# Patient Record
Sex: Female | Born: 1960 | Race: White | Hispanic: No | Marital: Married
Health system: Northeastern US, Academic
[De-identification: ages and names within clinical notes are randomized; demographics above are authoritative.]

---

## 2019-11-30 ENCOUNTER — Encounter: Admit: 2019-11-30 | Payer: PRIVATE HEALTH INSURANCE | Attending: Hematology & Oncology

## 2019-12-01 ENCOUNTER — Encounter: Admit: 2019-12-01 | Payer: PRIVATE HEALTH INSURANCE

## 2019-12-01 ENCOUNTER — Encounter: Admit: 2019-12-01 | Payer: PRIVATE HEALTH INSURANCE | Attending: Hematology & Oncology

## 2019-12-10 ENCOUNTER — Encounter: Admit: 2019-12-10 | Payer: PRIVATE HEALTH INSURANCE | Attending: Hematology & Oncology

## 2020-02-27 ENCOUNTER — Encounter: Admit: 2020-02-27 | Payer: PRIVATE HEALTH INSURANCE

## 2020-07-19 ENCOUNTER — Encounter: Admit: 2020-07-19 | Payer: PRIVATE HEALTH INSURANCE | Attending: Hematology & Oncology

## 2020-07-20 ENCOUNTER — Encounter: Admit: 2020-07-20 | Payer: PRIVATE HEALTH INSURANCE | Attending: Hematology & Oncology

## 2021-02-03 ENCOUNTER — Telehealth: Admit: 2021-02-03 | Payer: PRIVATE HEALTH INSURANCE | Attending: Hematology & Oncology

## 2021-02-03 DIAGNOSIS — Z1231 Encounter for screening mammogram for malignant neoplasm of breast: Secondary | ICD-10-CM

## 2021-02-03 DIAGNOSIS — C50411 Malignant neoplasm of upper-outer quadrant of right female breast: Secondary | ICD-10-CM

## 2021-02-03 NOTE — Telephone Encounter
Mammogram order faxed to [facility] in [place] [phone].

## 2021-02-03 NOTE — Telephone Encounter
Patient stated she needs a script for a mammogram to be faxed to [facility] in [place]. The fax number is [phone].

## 2021-02-04 ENCOUNTER — Telehealth: Admit: 2021-02-04 | Payer: PRIVATE HEALTH INSURANCE | Attending: Hematology & Oncology

## 2021-02-04 ENCOUNTER — Encounter: Admit: 2021-02-04 | Payer: PRIVATE HEALTH INSURANCE

## 2021-02-04 NOTE — Telephone Encounter
Called and spoke with pt regarding mammogram rx. Informed pt she will need to see Dr. Leavy Cella prior to any breast imaging rx's as pt has not seen med onc in 2 yrs. Pt verbalized understanding, message sent to [facility] [address]k to schedule pt.

## 2021-02-04 NOTE — Telephone Encounter
Pt called needing to speak with a nurse needs clarification on scripts she requested.

## 2021-02-04 NOTE — Telephone Encounter
Spoke to [place]. She is requesting unilateral left side 3D mammo and B/L breast MRI scripts. Tinnie Gens, RN to call pt.

## 2021-02-11 ENCOUNTER — Ambulatory Visit: Admit: 2021-02-11 | Payer: PRIVATE HEALTH INSURANCE | Attending: Hematology & Oncology

## 2021-02-11 DIAGNOSIS — E8940 Asymptomatic postprocedural ovarian failure: Secondary | ICD-10-CM

## 2021-02-11 DIAGNOSIS — Z90722 Acquired absence of ovaries, bilateral: Secondary | ICD-10-CM

## 2021-02-11 DIAGNOSIS — E038 Other specified hypothyroidism: Secondary | ICD-10-CM

## 2021-02-11 DIAGNOSIS — Z170 Estrogen receptor positive status [ER+]: Secondary | ICD-10-CM

## 2021-02-12 DIAGNOSIS — E063 Autoimmune thyroiditis: Secondary | ICD-10-CM

## 2021-02-12 DIAGNOSIS — C50411 Malignant neoplasm of upper-outer quadrant of right female breast: Secondary | ICD-10-CM

## 2021-02-15 ENCOUNTER — Telehealth: Admit: 2021-02-15 | Payer: PRIVATE HEALTH INSURANCE | Attending: Hematology & Oncology

## 2021-02-15 DIAGNOSIS — C50411 Malignant neoplasm of upper-outer quadrant of right female breast: Secondary | ICD-10-CM

## 2021-02-15 NOTE — Telephone Encounter
Spoke with pt, she has recently had a telemedicine visit with Dr. Leavy Cella.  Per pt request she would like Dr. Leavy Cella to order the [facility] and MRI breast bilateral, I did inform her that she has had these in the past with Dr. Elesa Massed but she is quite sure that Dr. Leavy Cella should be ordering this for her.  Dr. Leavy Cella aware and will order the imaging.  She would like to have this done at [facility] in [place] [phone] phone) )fax [phone]).

## 2021-02-15 NOTE — Telephone Encounter
Pt called back again wanting to speak with a nurse.

## 2021-02-16 ENCOUNTER — Telehealth: Admit: 2021-02-16 | Payer: PRIVATE HEALTH INSURANCE | Attending: Hematology & Oncology

## 2021-02-16 NOTE — Telephone Encounter
Pt called.Returned phone call about an MRI.

## 2021-02-18 ENCOUNTER — Telehealth: Admit: 2021-02-18 | Payer: PRIVATE HEALTH INSURANCE | Attending: Hematology & Oncology

## 2021-02-18 NOTE — Telephone Encounter
MRI Breast orders faxed to [facility] [id number] Tax ID # [id number] Phone: [phone]: [phone] images from [facility] were requestedPhone: [phone] [phone] several messages (today and other days this week) with patient to verify the facility and to help her book the scans.

## 2021-02-18 NOTE — Telephone Encounter
Pt called wanting to speak with you.

## 2021-02-22 ENCOUNTER — Encounter: Admit: 2021-02-22 | Payer: PRIVATE HEALTH INSURANCE | Attending: Hematology & Oncology

## 2021-02-28 NOTE — Progress Notes
ONCOLOGY NOTECheryl M Juarez, 07/14/1962Provider: Dr. Andee Poles [facility]: [id number]/13/2022?Interval History: ?  Kim Juarez is a 60 y.o. 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??HPI5/13/2022This patient presents to the clinic today as a follow-up via MyChart Video VisitRemains NED with breast cancer, Stage I, pT1b,pN0, ER+, HER2 +,now ~ 11 yrs post Dx s/p bilateral oophorectomy,without TAHNow 13 years post diagnosis, stableShe has not been seen in 2 yearsThe patient has no complaints at this time, feeling wellQuestions regarding what to do if she were to contract COVID-19; Paxlovid is safe to take and can lower the risk of hospitalization by 80%Questions reviewed, and answers provided The patient is doing well, overallReassurances givenF/u ??06/29/2020This patient presents to the clinic today as a follow-up. Remains NED with breast cancer, Stage I, pT1b,pN0, ER+, HER2 +,now ~ 11 yrs post Dx s/p bilateral oophorectomy,without TAHAs in previous notes,patient refused all f/u endocrine therapy or anti-HER2 therapy,against recommnedationsPatient notes she is doing well overall. She remains free of arthralgias, bone pain, hot flashes, other complaintsReviewed lab results with patient from 03/22/2019 that revealed a TSH WNL and T4 WNL.Continues to exercise and maintain weight with prudent diet, associated with risk reduction?06/26/19This patient presents to the clinic today as a follow-up. She has a history of  Breast cancer, Stage 1 ER+,HER2+,now ~ 10 yrs post DxPt is doing well, overall. Pt has followed up with Dr. Elesa Massed and reports no issues.Labs obtained on 02/26/18 were unremarkable. Pt is asymptomatic today. Asking about her A1C which was 5.7 at last check. Doing wellRecent f/u MRI,breasts noted thickening of skin,lower inner quadrant,right side at implant side post mastectomyShe is now 10 yrs post surgery, has not had f/u with Dr Elesa Massed and suggest she see her for f/u as wellShe is otherwise wellReviewed my exam which was completely negative with no kin changes, no thickening,induration,erythema,warmthExplained that infection unlikely (her concern),but entirely negative exam and favor f/u OV with Dr Dorisann Frames NED with breast cancer, Stage I, pT1b,pN0, ER+, HER2 +, s/p bilateral oophorectomy,without TAHAs in previous notes,patient refused f/u endocrine therapy or anti-HER2 therapy,against recommnedationsShe remains free of arthralgias, bone pain, hot flashes, other complaintsContinues to exercise and maintain weight with prudent diet, associated with risk reduction?Pertinent Medical History: ?    Patient Active Problem List ? Diagnosis ? ? Hypothyroidism due to Hashimoto's thyroiditis ? ? H/O bilateral oophorectomy ? ? Surgical menopause ? ? Malignant neoplasm of upper-outer quadrant of right breast in female, estrogen receptor positive (HC Code) (HC CODE) (HC Code) ? ? ? 2008:  R breast cancer found on mri done given FH breast cancer in mother.  Had mastectomy for associated extensive DCIS.? ?Allergies:    Allergies Allergen Reactions ? Amoxicillin Hives ??Current Medications: No outpatient medications have been marked as taking for the 02/11/21 encounter (Appointment) with Calton Golds, MD. ??History:   Past Medical History: Diagnosis Date ? Cancer (HC Code) 2008 ? Breast cancer ? Hashimoto's thyroiditis ? ? Malignant neoplasm of upper-outer quadrant of right breast in female, estrogen receptor positive (HC Code) 05/19/2012 ? 2008:  R breast cancer found on mri done given FH breast cancer in mother.  Had mastectomy for associated extensive DCIS. ?    Past Surgical History: Procedure Laterality Date ? BILATERAL OOPHORECTOMY Bilateral 03/2007 ? BREAST SURGERY ? 2008 ? rt mastectomy,reconstruction, Drs Elesa Massed,  Atkiss, ?Social History  ?    Socioeconomic History ? Marital status: Married ? ? Spouse name: Not on file ? Number of children: Not on file ? Years of education: Not on file ? Highest education level: Not on file Occupational History ? Not on file Tobacco Use ? Smoking status: Never Smoker ? Smokeless tobacco: Never Used Vaping Use ? Vaping Use: Never used Substance and Sexual Activity ? Alcohol use: No ? Drug use: No ? Sexual activity: Yes ? ? Partners: Male Other Topics Concern ? Not on file Social History Narrative ? Not on file ?Social Determinants of Health ?  Financial Resource Strain:  ? Difficulty of Paying Living Expenses:  Food Insecurity:  ? Worried About Programme researcher, broadcasting/film/video in the Last Year:  ? Barista in the Last Year:  Transportation Needs:  ? Freight forwarder (Medical):  ? Lack of Transportation (Non-Medical):  Physical Activity:  ? Days of Exercise per Week:  ? Minutes of Exercise per Session:  Stress:  ? Feeling of Stress :  Social Connections:  ? Frequency of Communication with Friends and Family:  ? Frequency of Social Gatherings with Friends and Family:  ? Attends Religious Services:  ? Active Member of Clubs or Organizations:  ? Attends Banker Meetings:  ? Marital Status:  Intimate Partner Violence:  ? Fear of Current or Ex-Partner:  ? Emotionally Abused:  ? Physically Abused:  ? Sexually Abused:   ?Family History Problem Relation Age of Onset ? Cancer Mother ? ? Breast cancer Mother ? ? Thyroid cancer Mother ? ? Hypothyroidism Father ? ??Review of Systems: ?Review of SystemsConstitutional: Negative for chills, fever, malaise/fatigue and weight loss.HENT: Negative.Eyes: Negative for blurred vision, double vision, photophobia, pain, discharge and redness.Respiratory: Negative for cough and shortness of breath.Cardiovascular: Negative for chest pain, orthopnea and leg swelling.Gastrointestinal: Negative for abdominal pain, blood in stool, constipation, diarrhea, melena, nausea and vomiting.Genitourinary: Negative.Musculoskeletal: Negative.Neurological: Negative for focal weakness.Psychiatric/Behavioral: Negative for memory loss.?As noted in HPI, otherwise 10 point review of systems negative.?Physical Exam: ?No physical exam or vitals obtained due to Mychart video visit.?Labs and Imaging: Recent Labs:          Recent Lab Values  ?   04/28/2015 04/28/2015 07/14/2015 03/27/2018 08/20/2018 ?? ?? ?? ? ? 12:41 PM 12:45 PM 12:26 PM 12:50 PM  9:26 AM ? ? ? ? WBC 5.0 -- 7.9 5.1 4.0 ? ? ? ? Hemoglobin 14.7 -- 14.5 13.6 13.7 ? ? ? ? Platelet 317 -- 289 250 317 ? ? ? ? ANC -- -- 5.78 2.74 1.9 ? ? ? ? LDL -- -- -- -- 124 ? ? ? ? AST -- 18 16 20 21  ? ? ? ? ALT -- 29 24 28 28  ? ? ? ? Serum Creatinine -- 0.7 0.66 0.58 0.65 ? ? ? ? Bilirubin -- 0.3 0.4 0.3 0.3 ? ? ? ? ? ? ?       ? ?No results found for this or any previous visit (from the past 1008 hour(s)).No results found for this or any previous visit (from the past 1008 hour(s)).No results found for this or any previous visit (from the past 1008 hour(s)).?Assessment / Plan:  1. Breast cancer, Stage 1 ER+,HER2+,now ~ 13 yrs post Dx, refused both adjuvant endocrine and antiHER2 therapy, by choice Clinically NEDContinue to follow up with Dr. Elesa Massed.Overall reassured by exam but needs careful f/u??2. Post-oophorectomy, secondary premature menopause Limited symptoms, minimal vaginal drynessMonitor for bone loss  all secondary to low estrogenContinue with routine bone density testing.F/U dietary Calcium,favor < 1000 mg as supplement, Vitamin D Maintain high level of physical activity, both aerobic and weight trainingVIDEO TELEHEALTH VISIT: This clinician is part of the telehealth program and is conducting this visit in a currently approved location. For this visit the clinician and patient were present via interactive audio & video telecommunications system that permits real-time communications, via the [employer].?Patient's use of the telehealth platform followed consent and acknowledges agreement to permit telehealth for this visit.?State patient is located in: [place] clinician is appropriately licensed in the above state to provide care for this visit.?Other individuals present during the telehealth encounter and their role/relation: none?If billing based on time, please complete (Not required if billing based on MDM):?????????????? ??????????? Total time spent in medical video consultation: 34 min; Total time spent by the provider on the day of service, which includes time spent on chart review, medical video consultation, education, coordination of care/services and counseling?Because this visit was completed over video, a hands-on physical exam was not performed.? Patient/parent or guardian understands and knows to call back if condition changes.??Scribed for Calton Golds, MD by Carey Bullocks, medical scribe Feb 11, 2021??The documentation recorded by the scribe accurately reflects the services I personally performed and the decisions made by me. I reviewed and confirmed all material entered and/or pre-charted by the scribe.?DBBoyd,MD,MS???

## 2021-03-25 ENCOUNTER — Encounter: Admit: 2021-03-25 | Payer: PRIVATE HEALTH INSURANCE | Attending: Vascular and Interventional Radiology

## 2021-03-25 IMAGING — MR MRI BREAST BILATERAL W/WO CONTRAST
7 of 16 series · 17 of 48 positions shown · IV contrast (gadavist)
Comparison: Breast MRI dated 02/27/2020

FINAL Diagnostic Imaging Report 
________________________________________________________________________________________________ 
MRI BREAST BILATERAL W/WO CONTRAST, 03/25/2021 [DATE]: 
CLINICAL INDICATION: Malignant neoplasm of upper-outer quadrant of right breast 
in female, estrogen receptor positive, history of breast cancer, asymptomatic
TECHNIQUE: Multiplanar multisequence breast MRI with contrast. 3-D 
reconstructions and kinetic data generated. 6 mL of Gadavist was injected 
intravenously.

[Series 201: survey · axial · 7.0mm · 1.34mm/px · 1 of 25 slices shown]
[im 1/25]
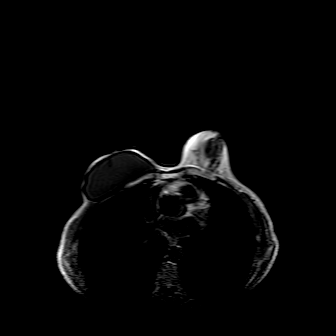

[Series 301: t1w_ffe_3d · axial · 1.6mm · 0.59mm/px · z∈[-133,+59]mm · 3 of 216 slices shown]
[im 1/216]
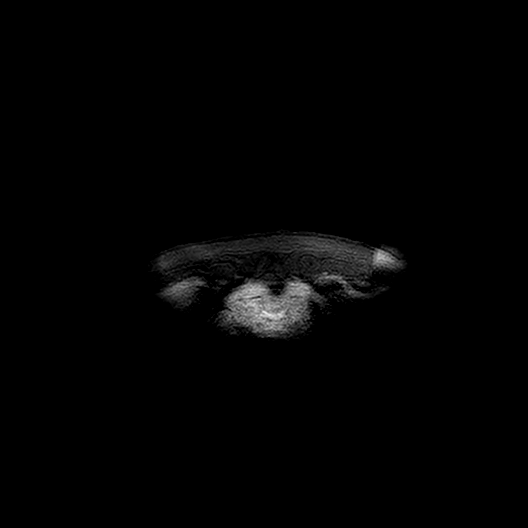
[im 108/216]
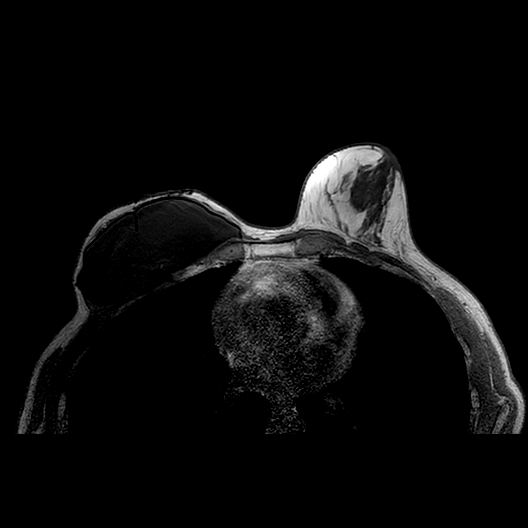
[im 216/216]
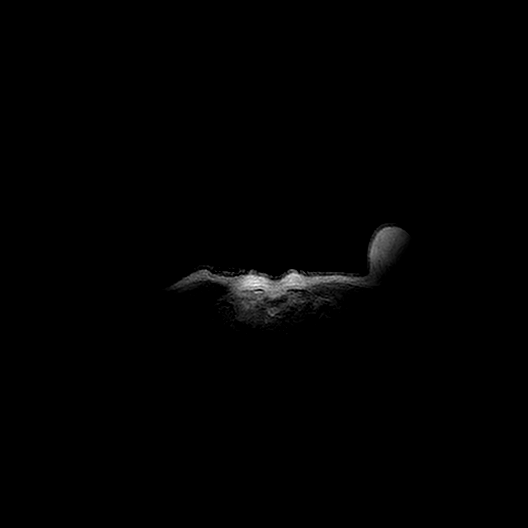

[Series 401: t2w_spair · axial · 3.0mm · 0.69mm/px · 1 of 62 slices shown]
[im 1/62]
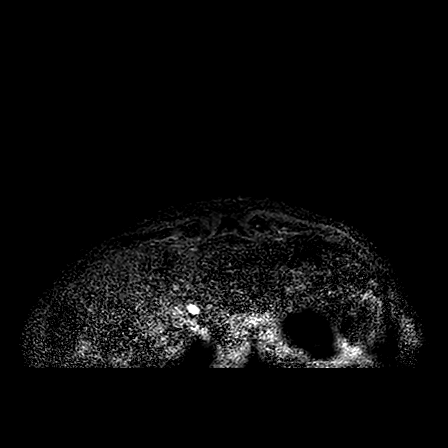

[Series 602: ssub dyn 1 · axial · 1.6mm · 0.54mm/px · z∈[-120,+64]mm · 3 of 231 slices shown]
[im 1/231]
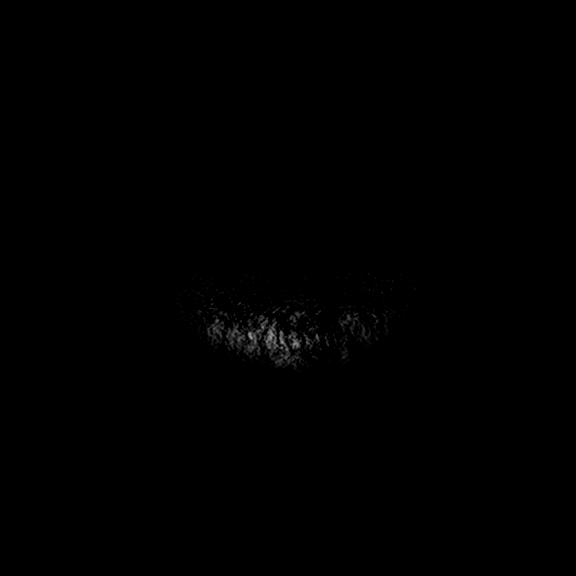
[im 116/231]
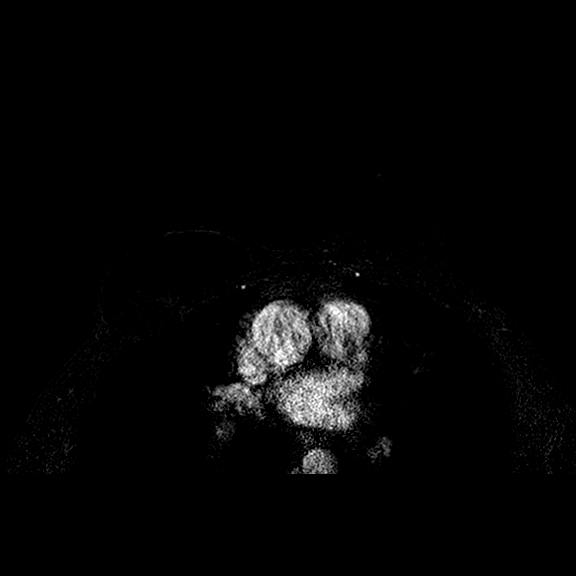
[im 231/231]
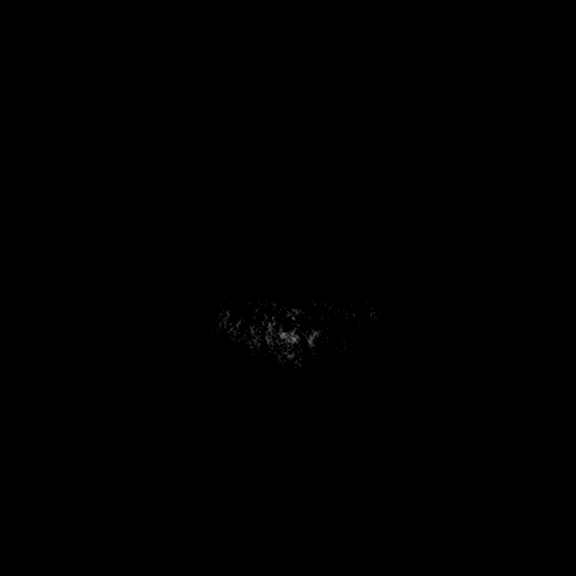

[Series 603: ssub dyn 2 · axial · 1.6mm · 0.54mm/px · z∈[-120,+64]mm · 4 of 231 slices shown]
[im 1/231]
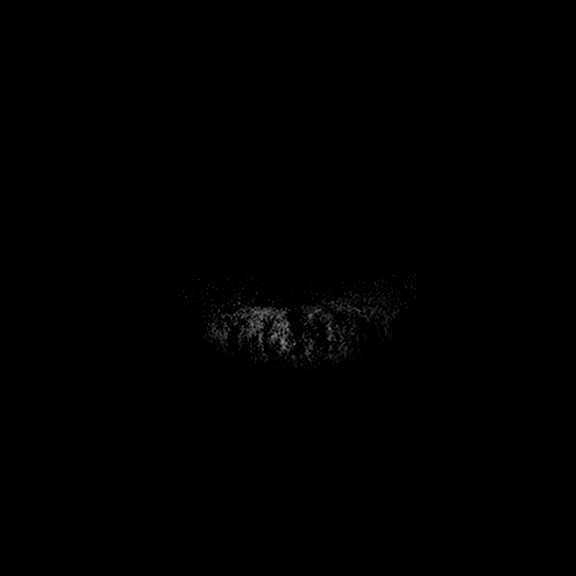
[im 77/231]
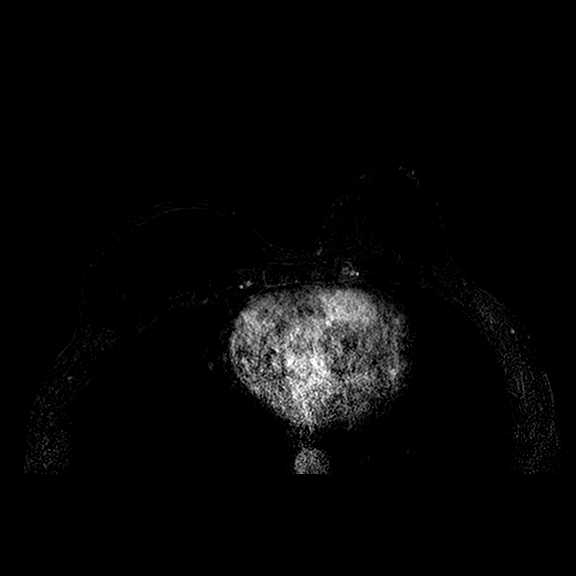
[im 154/231]
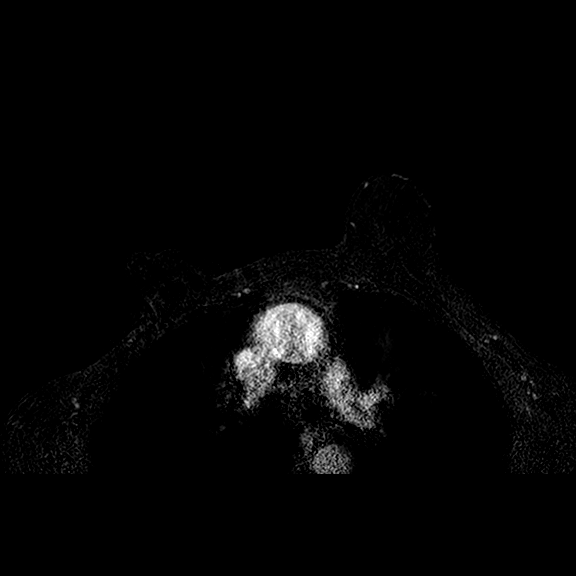
[im 231/231]
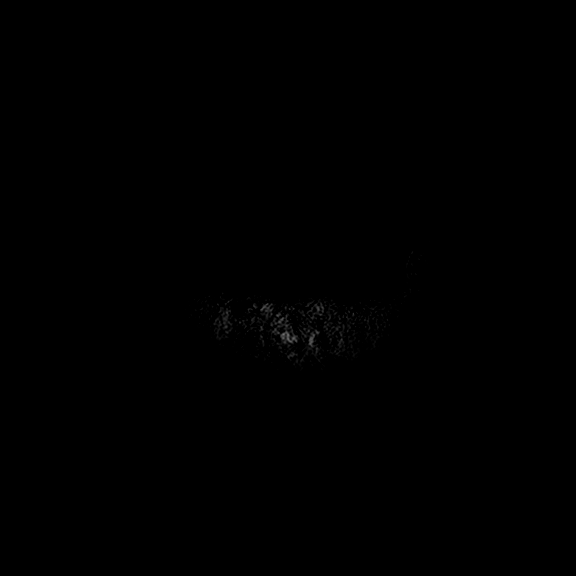

[Series 604: ssub dyn 3 · axial · 1.6mm · 0.54mm/px · z∈[-120,+64]mm · 4 of 231 slices shown]
[im 1/231]
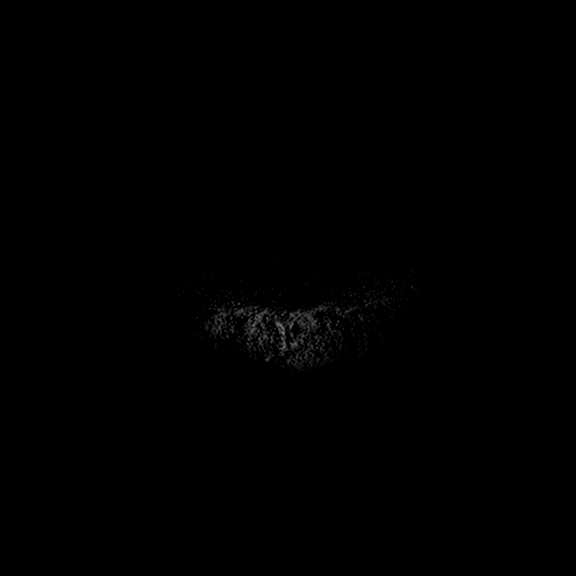
[im 77/231]
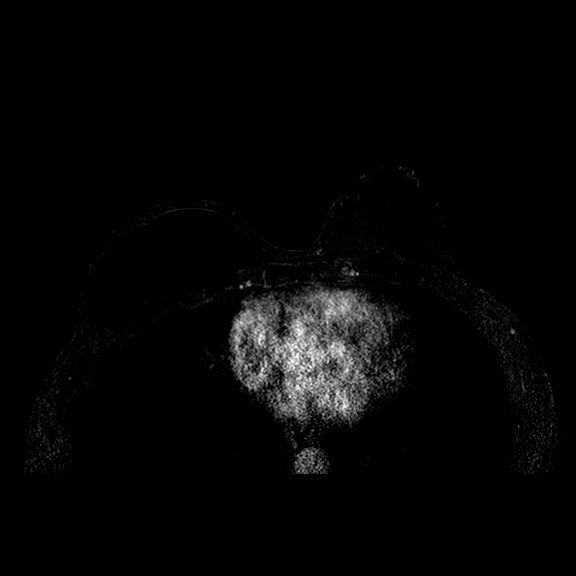
[im 154/231]
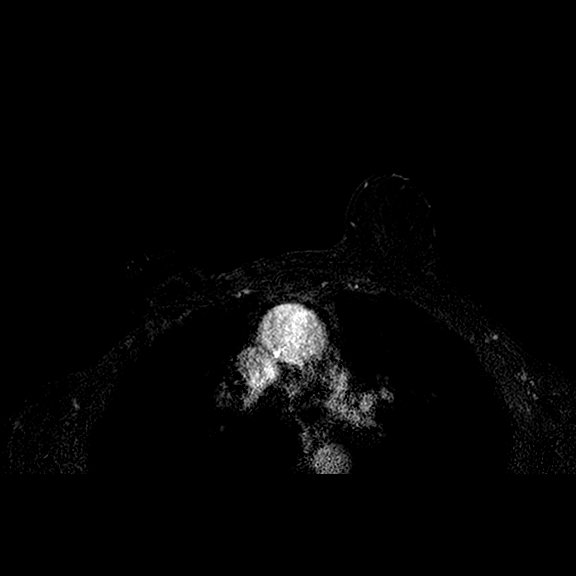
[im 231/231]
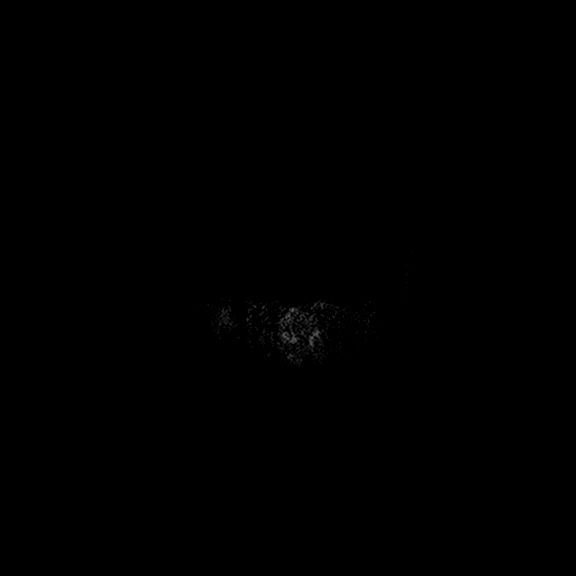

[Series 605: ssub dyn 4 · axial · 1.6mm · 0.54mm/px · 1 of 231 slices shown]
[im 1/231]
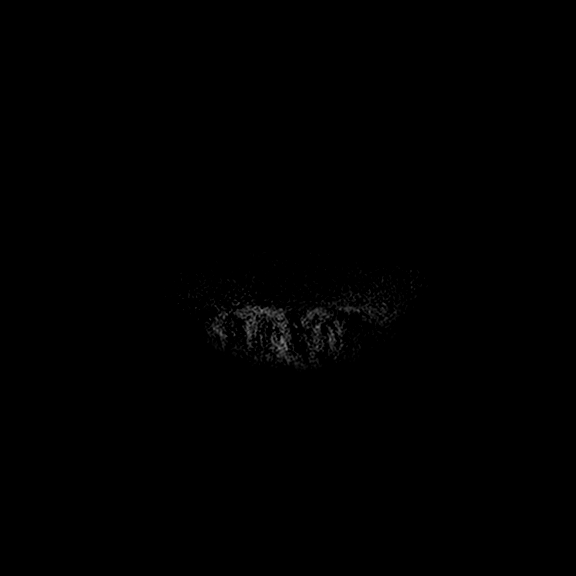

[17 of 48 positions shown; findings below may reference images not displayed]

FINDINGS: Minimal background parenchymal enhancement is present.  
RIGHT BREAST: Surgical changes of right mastectomy with implant reconstruction. 
Axillary node dissection with focal susceptibility artifact representing 
surgical clips. There is no suspicious right breast enhancing mass, non-mass 
enhancement, or enhancing focus. There is no suspicious right axillary or 
internal mammary chain lymphadenopathy. 
LEFT BREAST: There is no suspicious enhancing mass, non-mass enhancement, or 
enhancing focus throughout the left breast. The left nipple areolar complex 
appears normal. There is no suspicious left axillary or internal mammary chain 
lymphadenopathy. 
OTHER: None
IMPRESSION: No MRI evidence of malignancy throughout either breast. 
( BI-RADS 2) Benign findings. Routine mammographic follow-up is recommended.

## 2021-03-29 ENCOUNTER — Telehealth: Admit: 2021-03-29 | Payer: PRIVATE HEALTH INSURANCE | Attending: Hematology & Oncology

## 2021-03-29 NOTE — Telephone Encounter
Spoke with pt and let her know that he still havent seen the breast imaging from [place].  She will call and have them fax the results.

## 2021-03-29 NOTE — Telephone Encounter
Pt called.Per caller; wants imaging results.

## 2021-03-30 ENCOUNTER — Telehealth: Admit: 2021-03-30 | Payer: PRIVATE HEALTH INSURANCE | Attending: Hematology & Oncology

## 2021-03-30 NOTE — Telephone Encounter
Pt called.Per caller; wants MRI results.

## 2021-03-30 NOTE — Telephone Encounter
Spoke to [place], I let her know that her MRI fax results came through, Dr Leavy Cella has not yet had a chance to review as he is currently seeing OV patients. Pt verbalized understanding.

## 2021-03-31 ENCOUNTER — Telehealth: Admit: 2021-03-31 | Payer: PRIVATE HEALTH INSURANCE | Attending: Hematology & Oncology

## 2021-03-31 NOTE — Telephone Encounter
Spoke with pt and let her know that the imaging report has been received and that Dr. Leavy Cella will be calling her later today.

## 2021-03-31 NOTE — Telephone Encounter
Pt called.Per caller; follow up on telephone encounter from 03/30/2021 3:32 pm.

## 2021-05-10 IMAGING — MG MAMMOGRAM SCREENING LEFT UNILATERAL 3D TO
4 series · 4 of 12 positions shown · non-contrast
Comparison: Comparison was made to prior examinations.

FINAL Diagnostic Imaging Report 
________________________________________________________________________________________________ 
MAMMOGRAM SCREENING LEFT UNILATERAL 3D TO, 05/10/2021 [DATE]: 
CLINICAL INDICATION: History of right mastectomy for carcinoma 0553
TECHNIQUE: Unilateral left breast digital mammography and 3-D Tomosynthesis were 
obtained. In addition, computer-aided detection was utilized. 
BREAST DENSITY: (Level C) The breasts are heterogeneously dense, which may 
obscure small masses.

[L CC]
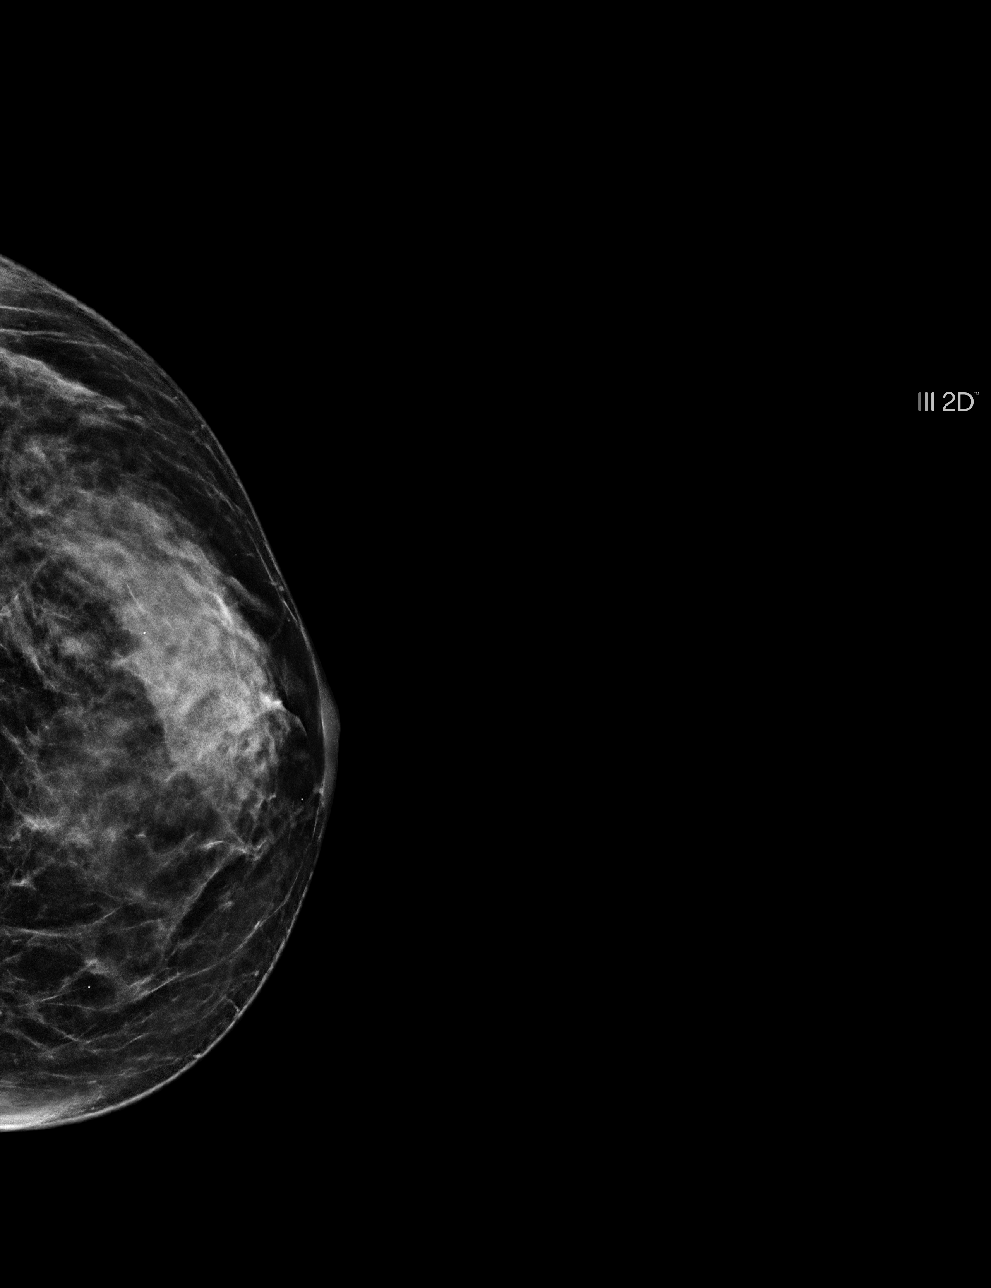

[L MLO]
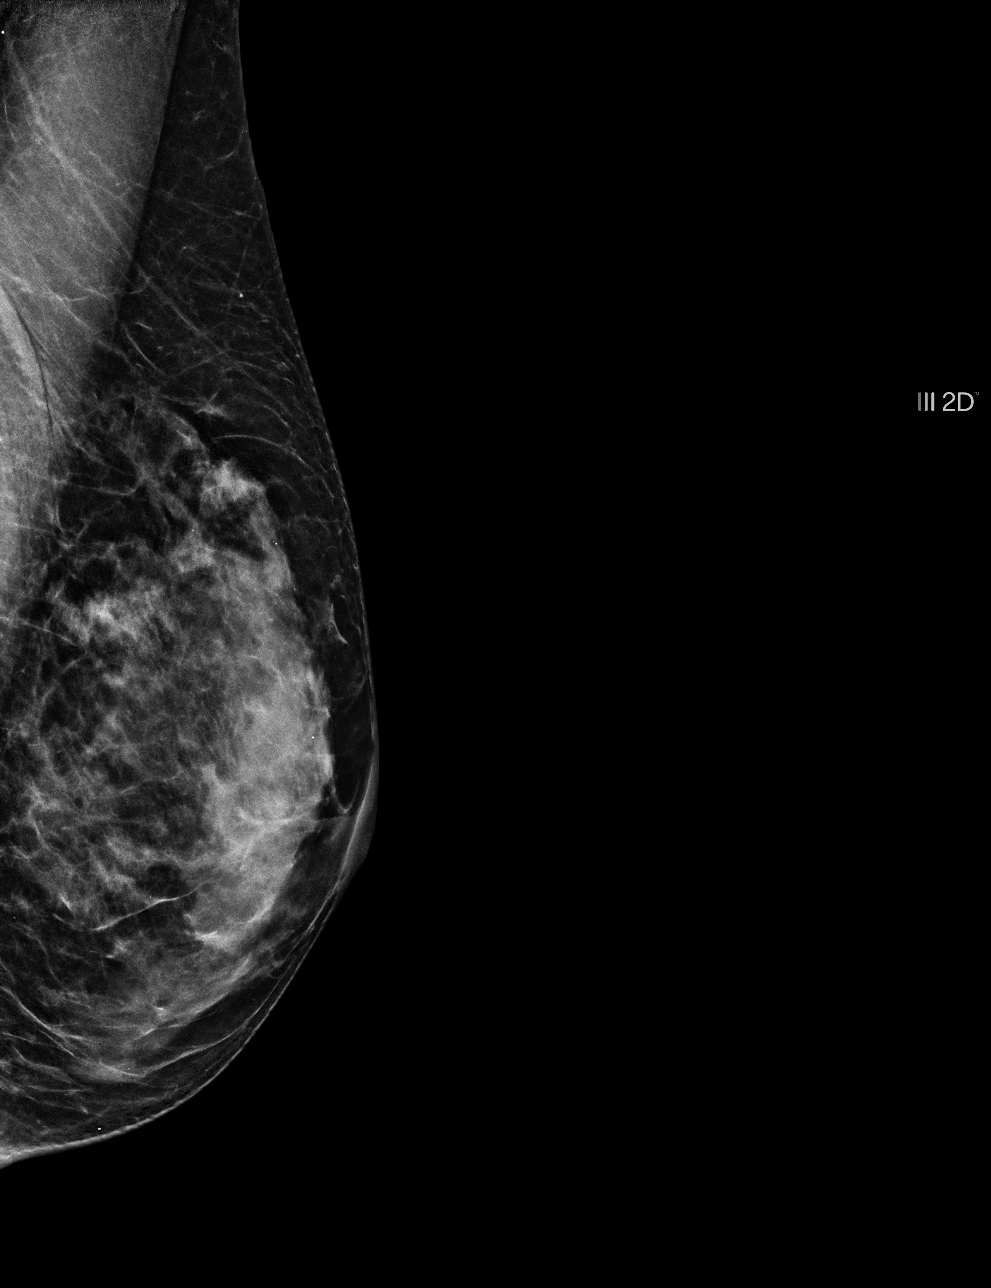

[L CC tomo · tomo slice 26/51.0]
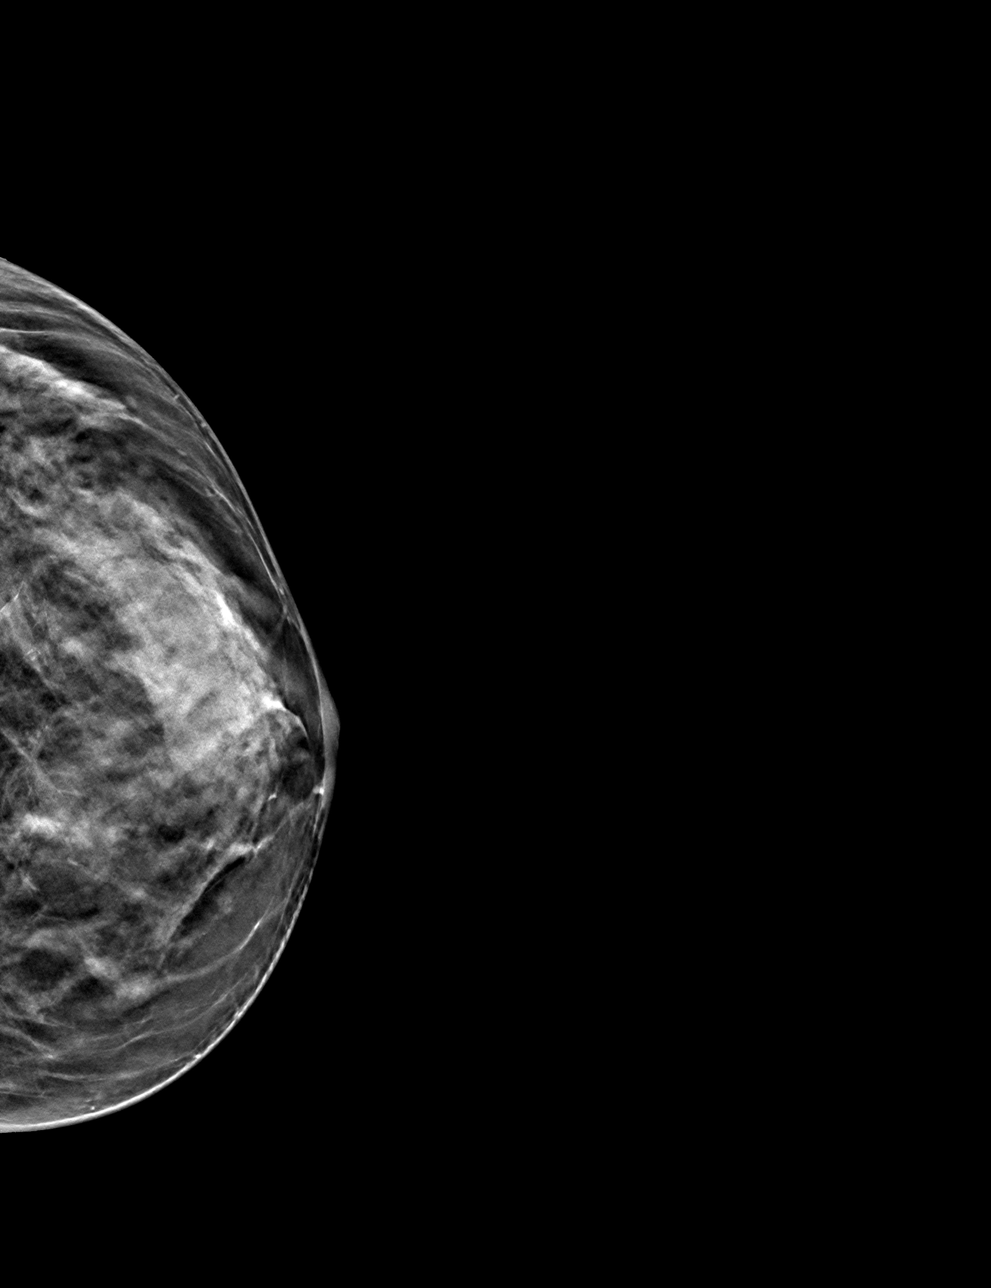

[L MLO tomo · tomo slice 25/48.0]
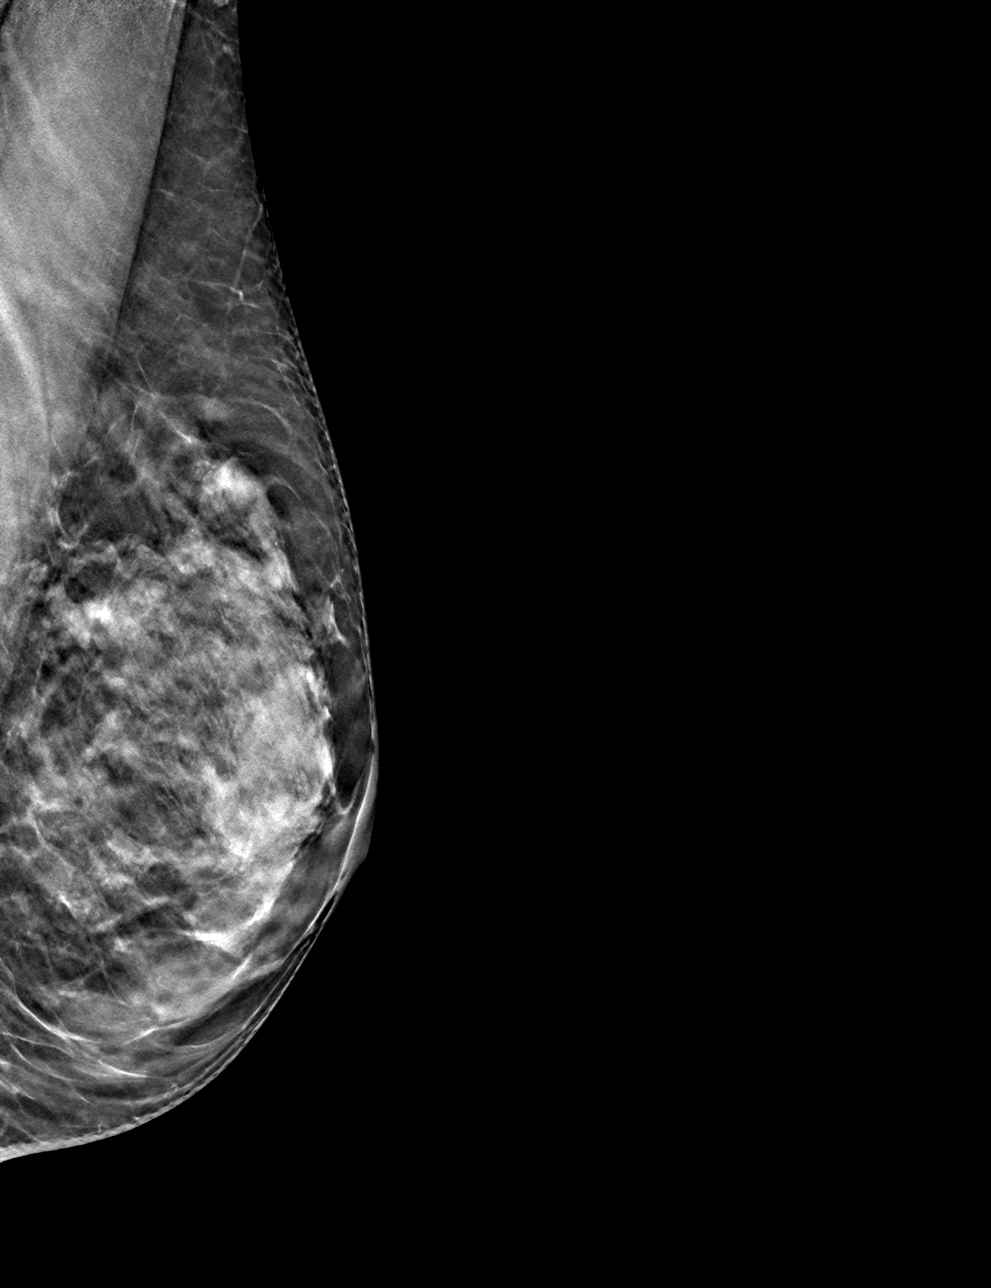

[4 of 12 positions shown; findings below may reference images not displayed]

FINDINGS: No suspicious mass, calcifications, or area of architectural 
distortion in the left breast.
IMPRESSION: No mammographic evidence of malignancy in the  left breast. 
(BI-RADS 2) Benign findings. Routine mammographic follow-up is recommended.

## 2022-01-06 IMAGING — CT CT ABDOMEN PELVIS W/ UROGRAM
2 of 6 series · 14 of 46 positions shown, 16 images · IV contrast (ISOVUE 300)
Comparison: None

________________________________________________________________________________________________ 
CT ABDOMEN PELVIS W/ UROGRAM, 01/06/2022 [DATE]: 
CLINICAL INDICATION: Microscopic hematuria. History of breast cancer. 
A search for DICOM formatted images was conducted for prior CT imaging studies 
completed at a non-affiliated media free facility.
TECHNIQUE: The region of interest was scanned without and with 100 mL of Isovue 
300 contrast injected intravenously on a high resolution CT scanner using dose 
reduction techniques.  Routine MPR reconstructions were performed.

[Series 5: abd/pel ax w · axial · 0.63mm/px · z∈[-460,-70]mm · 11 of 156 slices shown, 13 images]
[im 13/156  soft-tissue]
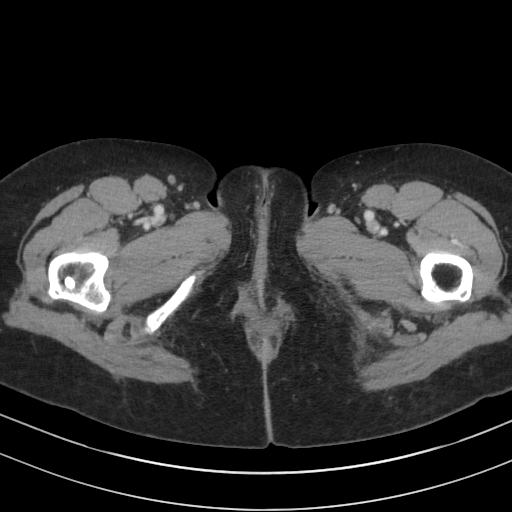
[im 13/156  bone]
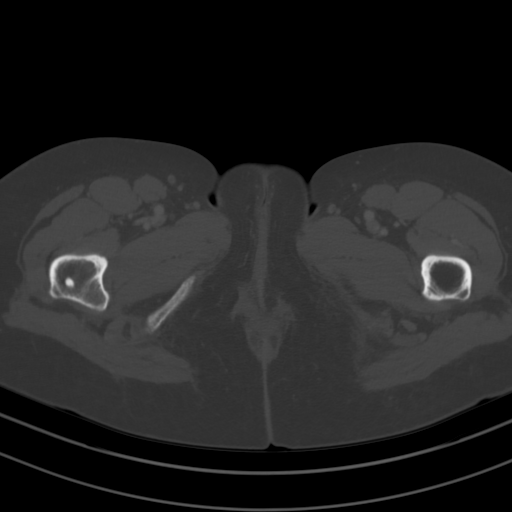
[im 26/156  soft-tissue]
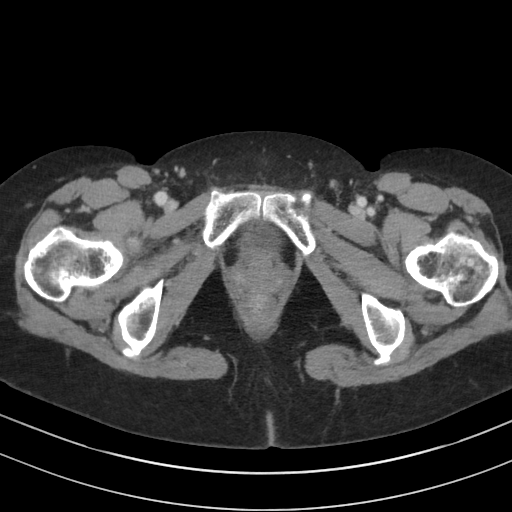
[im 39/156  soft-tissue]
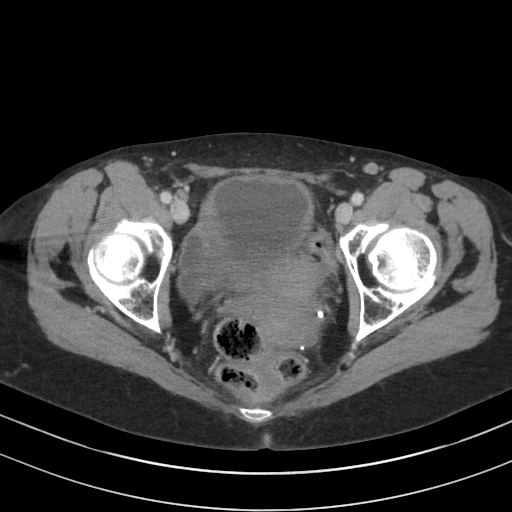
[im 52/156  soft-tissue]
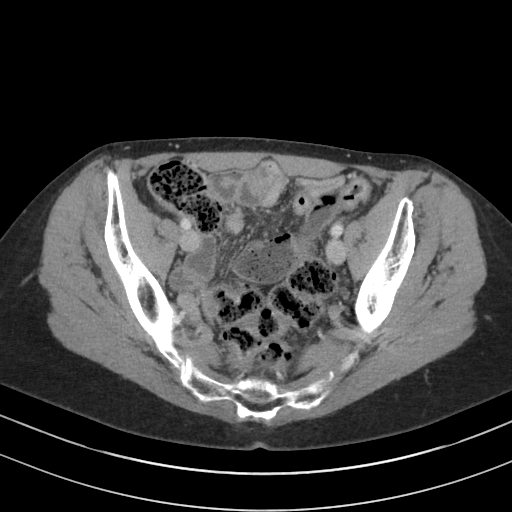
[im 65/156  soft-tissue]
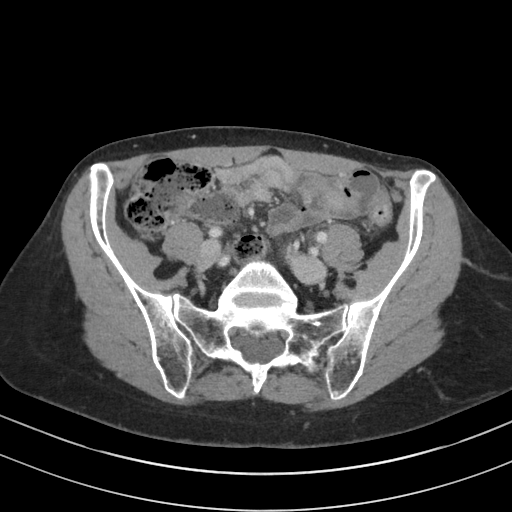
[im 78/156  soft-tissue]
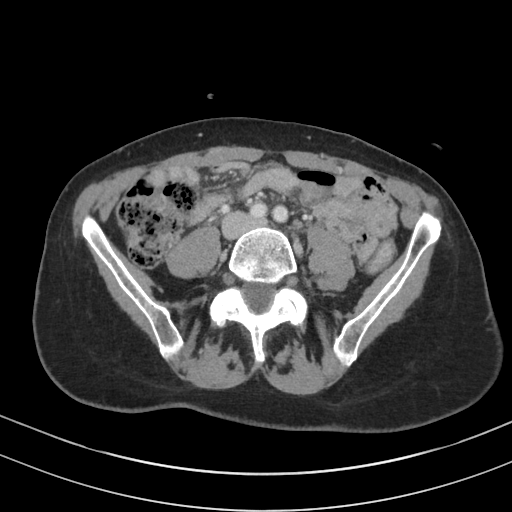
[im 91/156  soft-tissue]
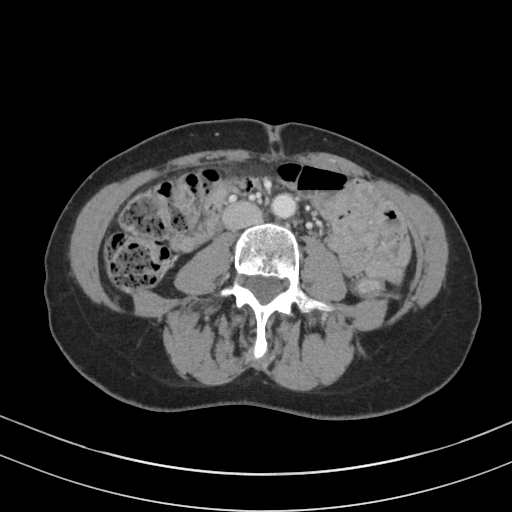
[im 104/156  soft-tissue]
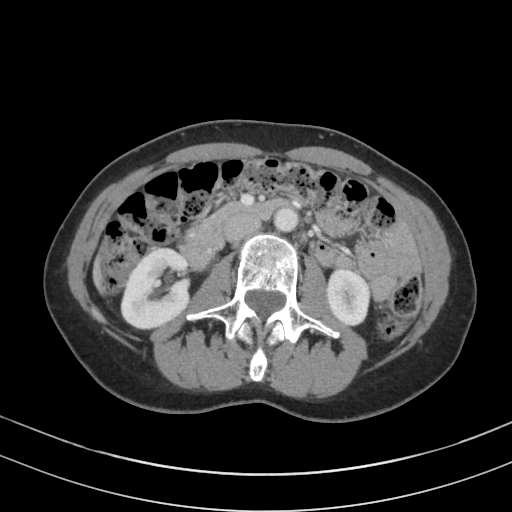
[im 117/156  soft-tissue]
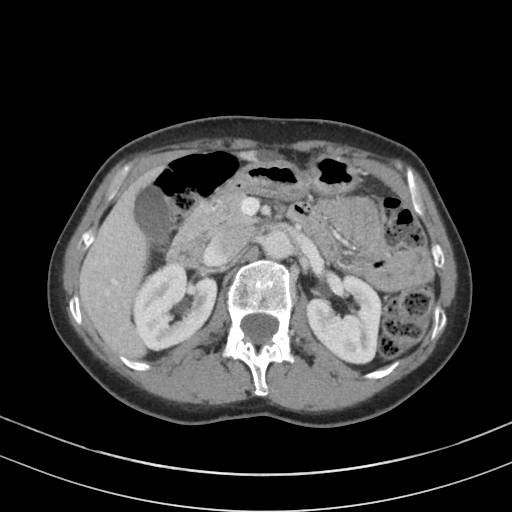
[im 117/156  bone]
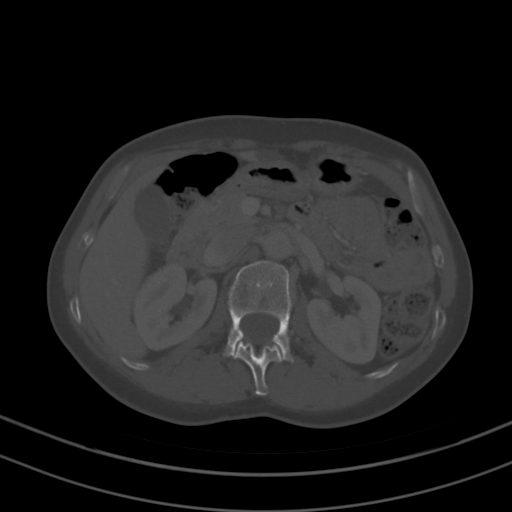
[im 130/156  soft-tissue]
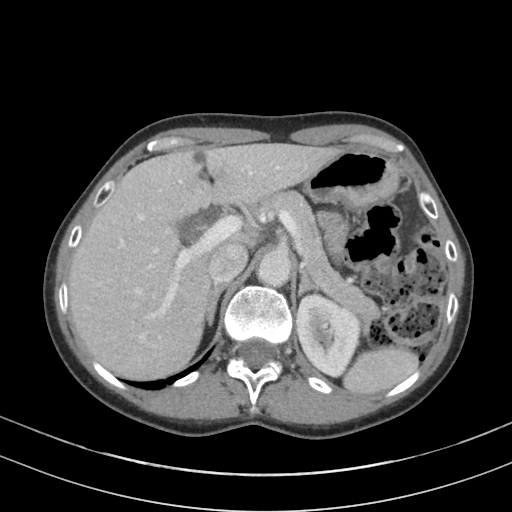
[im 143/156  soft-tissue]
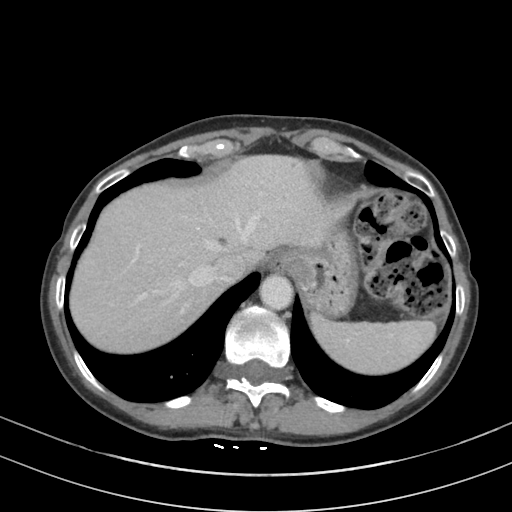

[Series 6: abd/pel cor w · coronal · 0.71mm/px · 3 of 124 slices shown]
[im 31/124  soft-tissue]
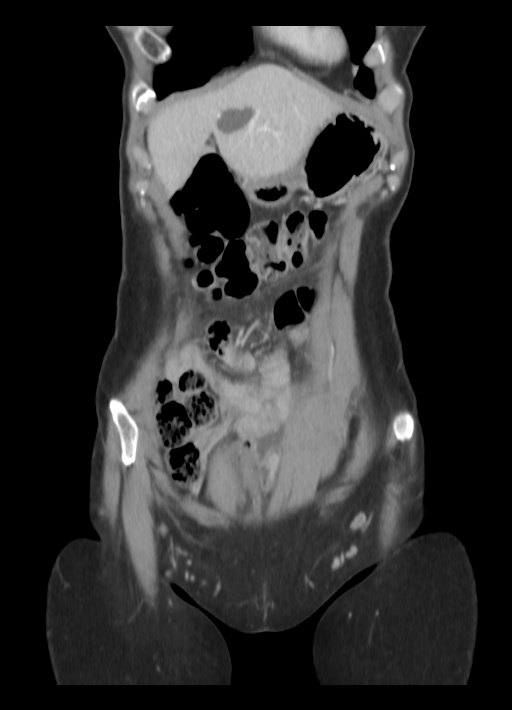
[im 62/124  soft-tissue]
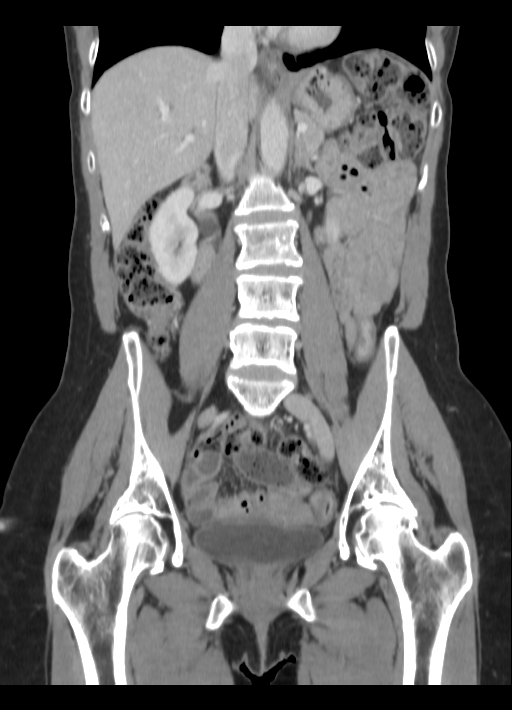
[im 93/124  soft-tissue]
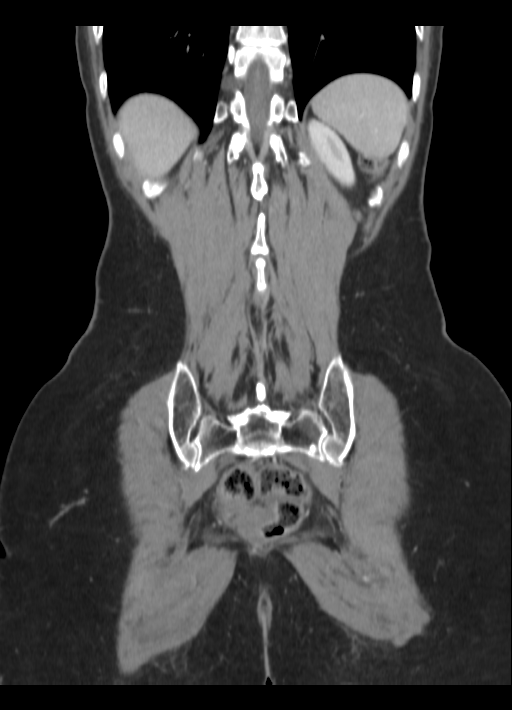

[14 of 46 positions shown; findings below may reference images not displayed]

FINDINGS: KIDNEYS: No masses.  No hydronephrosis.  No nephrolithiasis. Extrarenal pelves. 
URETERS: Normal caliber without filling defects.   
BLADDER: No mass.  Normal wall thickness.  No stone. 
LUNG BASES: Clear. 
HEPATOBILIARY: No mass or biliary dilatation. Multiple hepatic cysts. No 
gallstones. 
SPLEEN: Within normal limits. 
PANCREAS: No mass.  No pancreatic fluid collections. 
ADRENALS: No masses. 
LYMPH NODES: No adenopathy. 
STOMACH, SMALL BOWEL AND COLON: No bowel wall thickening or obstruction. Normal 
appendix. 
VASCULAR STRUCTURES: No aneurysm.  
MUSCULOSKELETAL: No acute osseous abnormality.  
ADDITIONAL FINDINGS: Right chest wall implant.
IMPRESSION: 1.  No renal/ureteral/bladder calculi or mass. 
2.  Multiple hepatic cysts.  
3.  Right chest wall implant. 
4.  No evidence of metastases. 
RADIATION DOSE REDUCTION: All CT scans are performed using radiation dose 
reduction techniques, when applicable.  Technical factors are evaluated and 
adjusted to ensure appropriate moderation of exposure.  Automated dose 
management technology is applied to adjust the radiation doses to minimize 
exposure while achieving diagnostic quality images.

## 2022-05-05 IMAGING — MR MRI BREAST BILATERAL W/WO CONTRAST
5 of 14 series · 13 of 48 positions shown · IV contrast (gadolinium)
Comparison: Breast MRI March 25, 2021.

________________________________________________________________________________________________ 
MRI BREAST BILATERAL W/WO CONTRAST, 05/05/2022 [DATE]: 
CLINICAL INDICATION: History of RIGHT breast cancer 1772 status post RIGHT 
mastectomy. High risk for malignancy.
TECHNIQUE: Multiple sequences were obtained in various planes with both before 
and after the intravenous administration of gadolinium. In addition to the 
routine images, three-dimensional renderings were performed on an independent 
workstation, time activity curves generated over areas of enhancement and 
computer-aided detection utilized.  6.5 mL of Gadavist were injected 
intravenously. 1.0 mL of Gadavist was discarded. Patient was scanned on a 3T 
magnet. .

[Series 201: survey · axial · 7.0mm · 1.34mm/px · 1 of 25 slices shown]
[im 1/25]
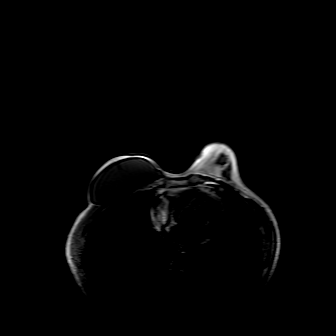

[Series 301: t1w_ffe_3d cs · axial · 1.6mm · 0.50mm/px · z∈[-134,+65]mm · 3 of 217 slices shown]
[im 1/217]
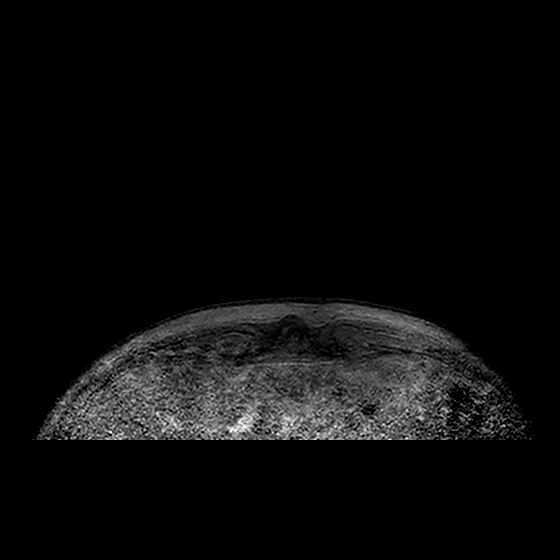
[im 109/217]
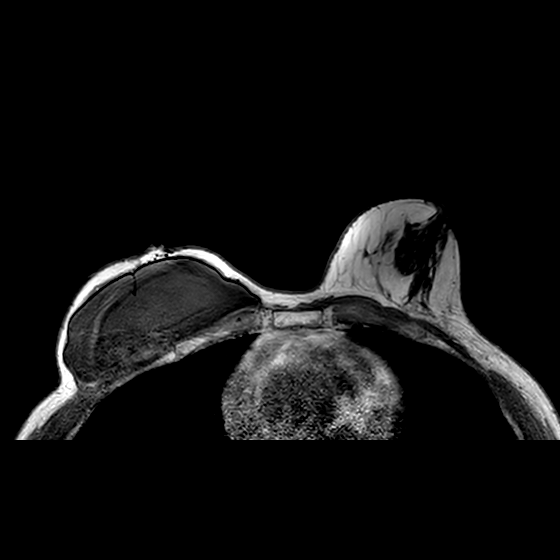
[im 217/217]
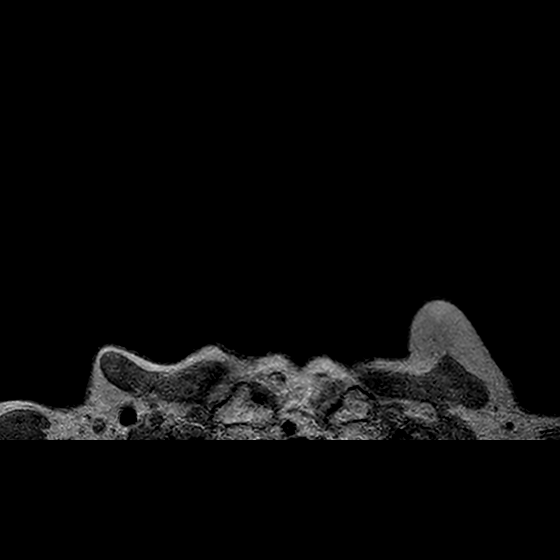

[Series 401: t2w_(id) · axial · 2.5mm · 0.65mm/px · z∈[-133,+64]mm · 2 of 80 slices shown]
[im 1/80]
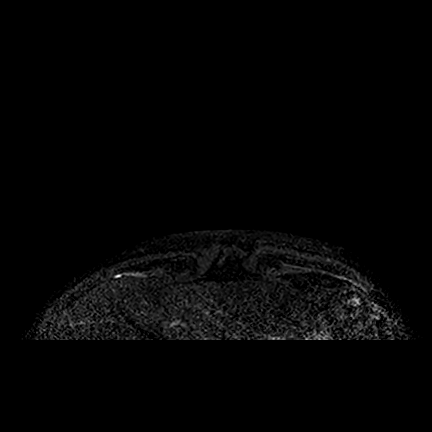
[im 80/80]
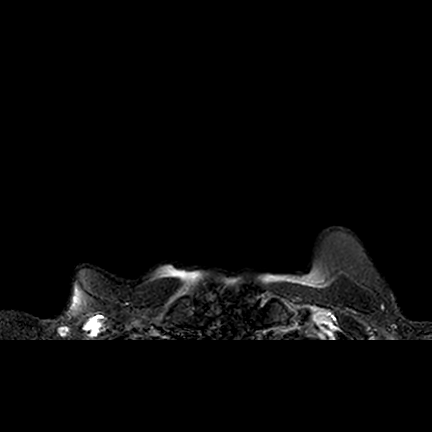

[Series 502: ssub dyn 1 · axial · 1.6mm · 0.40mm/px · z∈[-134,+65]mm · 5 of 250 slices shown]
[im 1/250]
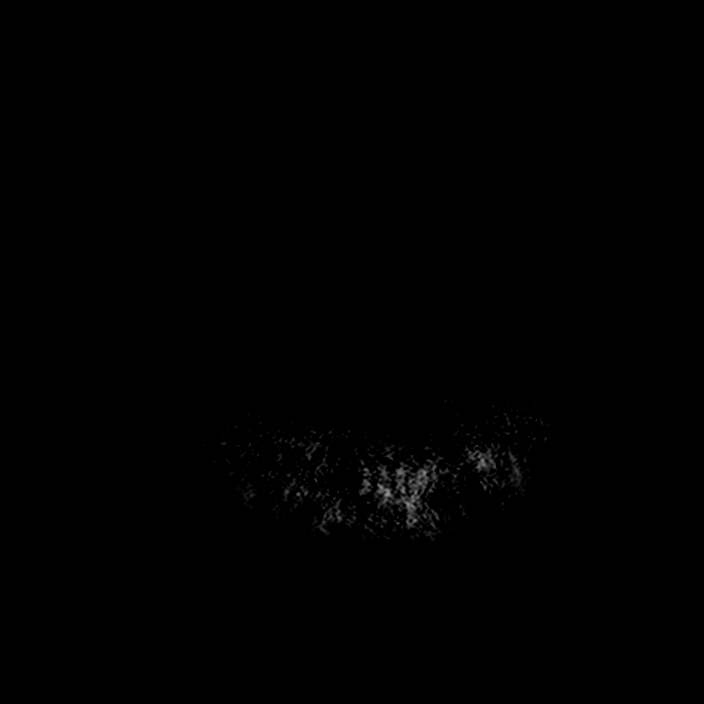
[im 63/250]
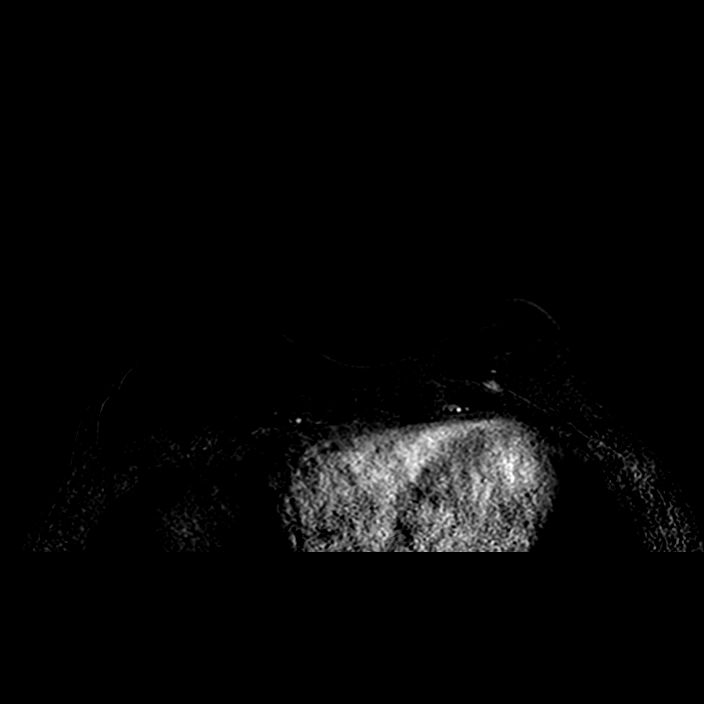
[im 125/250]
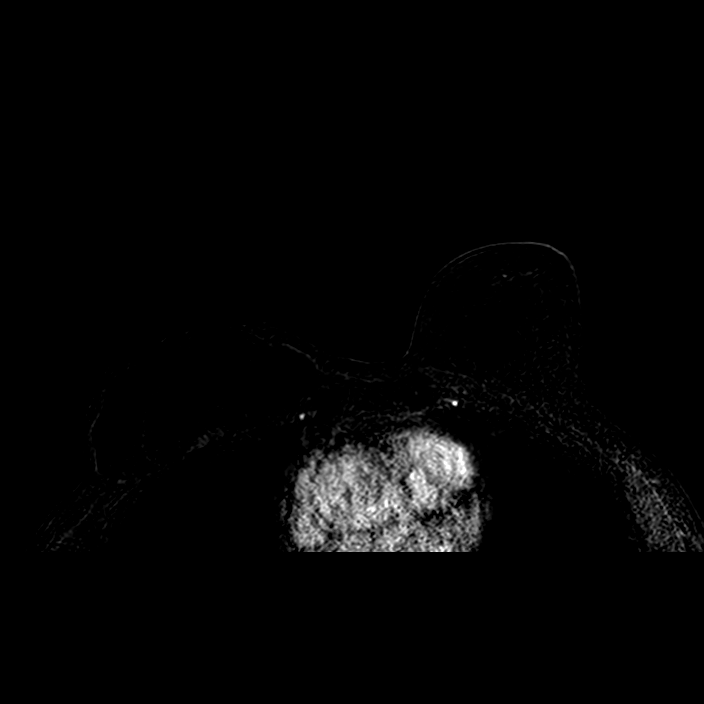
[im 187/250]
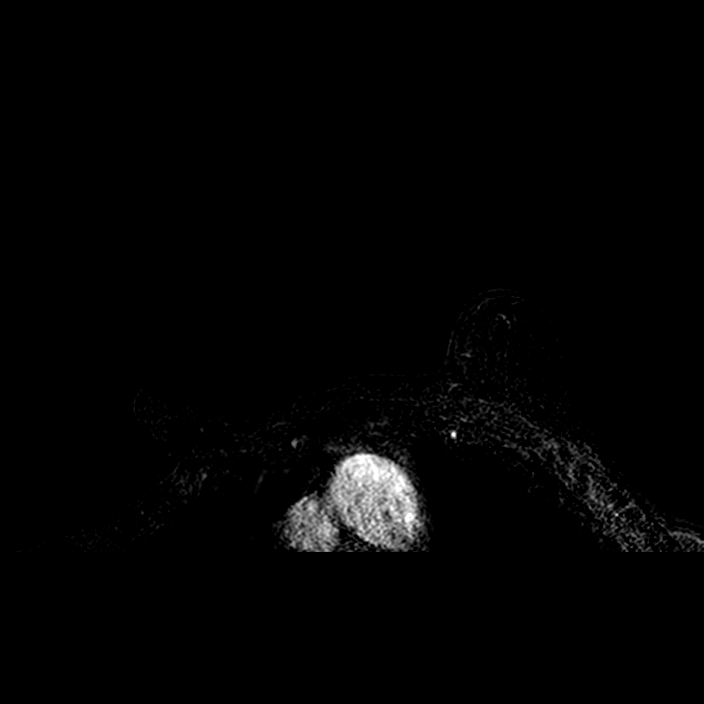
[im 250/250]
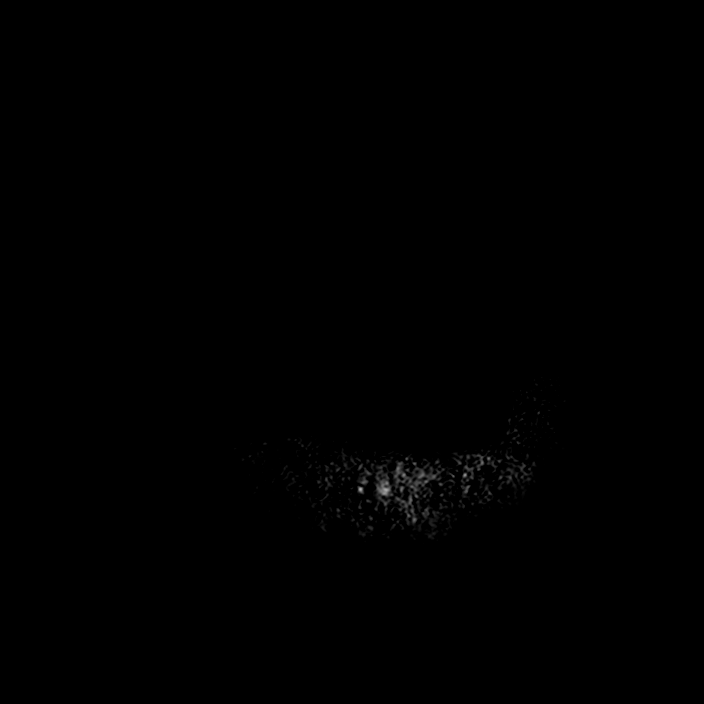

[Series 503: ssub dyn 2 · axial · 1.6mm · 0.40mm/px · z∈[-134,-85]mm · 2 of 250 slices shown]
[im 1/250]
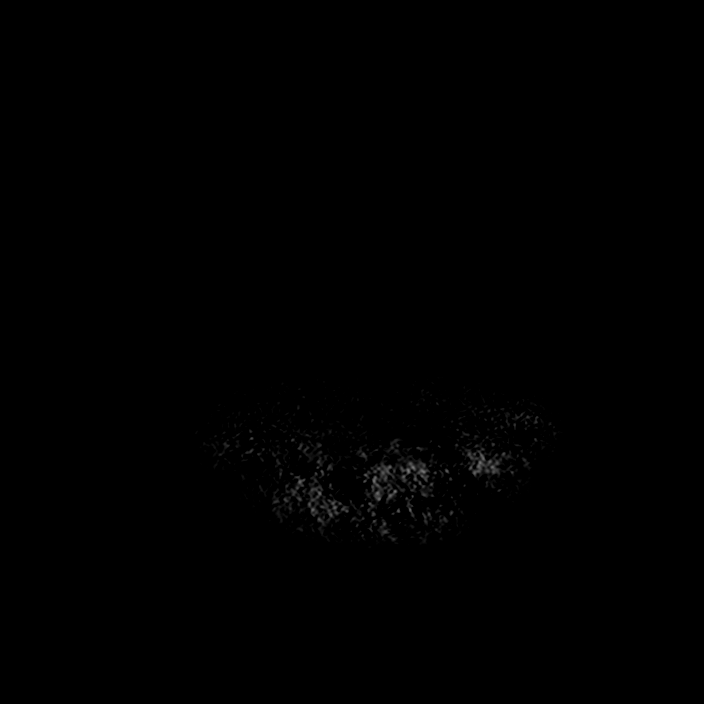
[im 63/250]
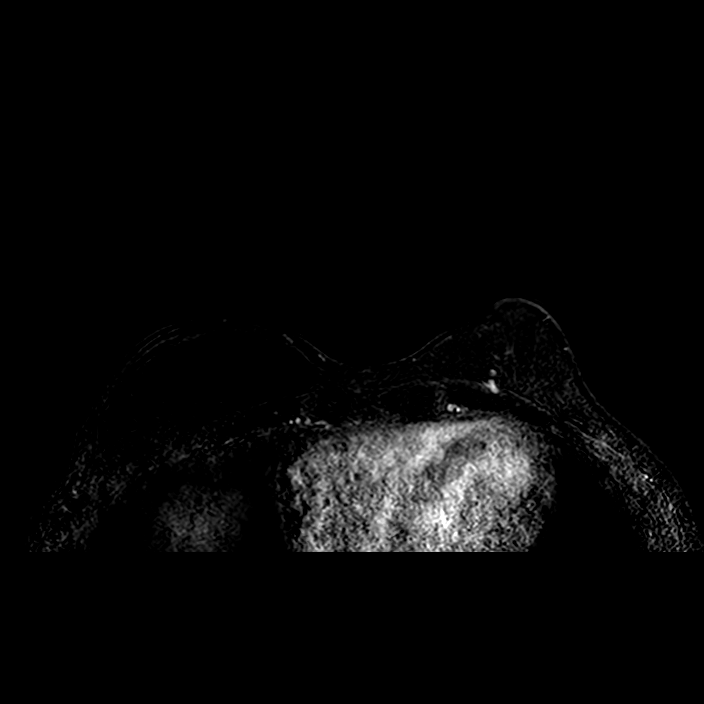

[13 of 48 positions shown; findings below may reference images not displayed]

FINDINGS: Right breast: 
There has been RIGHT mastectomy with implant reconstruction. There is a 
subpectoral silicone implant appearing intact. There is no abnormal enhancement 
in the RIGHT breast. Surgical clips are in the RIGHT axilla creating artifact. 
No suspicious lymph nodes are in the partially imaged RIGHT axilla. There are no 
suspicious internal mammary chain lymph nodes. 
Left breast: 
There are moderate amounts of residual fibroglandular tissue in the LEFT breast. 
No suspicious mass or focal enhancing lesions are in the LEFT breast. The lymph 
nodes in the partially imaged LEFT axilla appear normal. There is no internal 
mammary chain lymphadenopathy.
IMPRESSION: 1. No MRI evidence for breast malignancy. 
2. Prior RIGHT mastectomy with silicone implant reconstruction. 
(BI-RADS 2) Benign findings. Routine mammographic follow-up is recommended.

## 2022-05-15 ENCOUNTER — Encounter: Admit: 2022-05-15 | Payer: PRIVATE HEALTH INSURANCE | Attending: Vascular and Interventional Radiology

## 2023-01-02 IMAGING — MG MAMMOGRAPHY DIAGNOSTIC LEFT 3D TOMOSYNTH
4 series · 4 of 12 positions shown · non-contrast
Comparison: Comparison was made to prior examinations.

________________________________________________________________________________________________ 
MAMMOGRAPHY DIAGNOSTIC LEFT 3D TOMOSYNTH, 01/02/2023 [DATE]: 
CLINICAL INDICATION: History of right mastectomy in 6443
TECHNIQUE: Unilateral left breast digital mammography and 3-D Tomosynthesis were 
obtained. In addition, computer-aided detection was utilized. 
BREAST DENSITY: (Level C) The breasts are heterogeneously dense, which may 
obscure small masses.

[L CC]
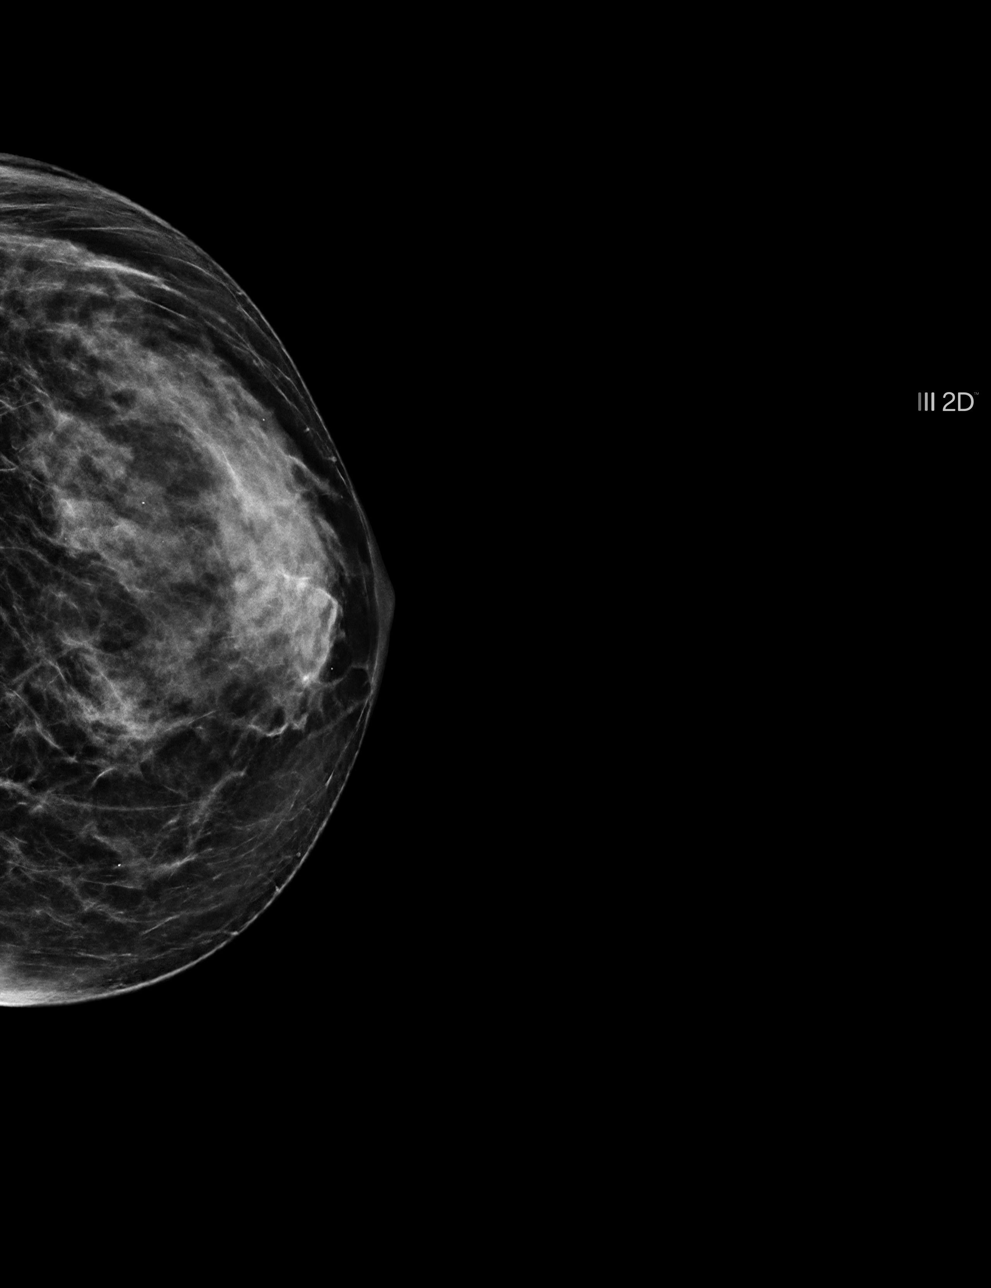

[L MLO]
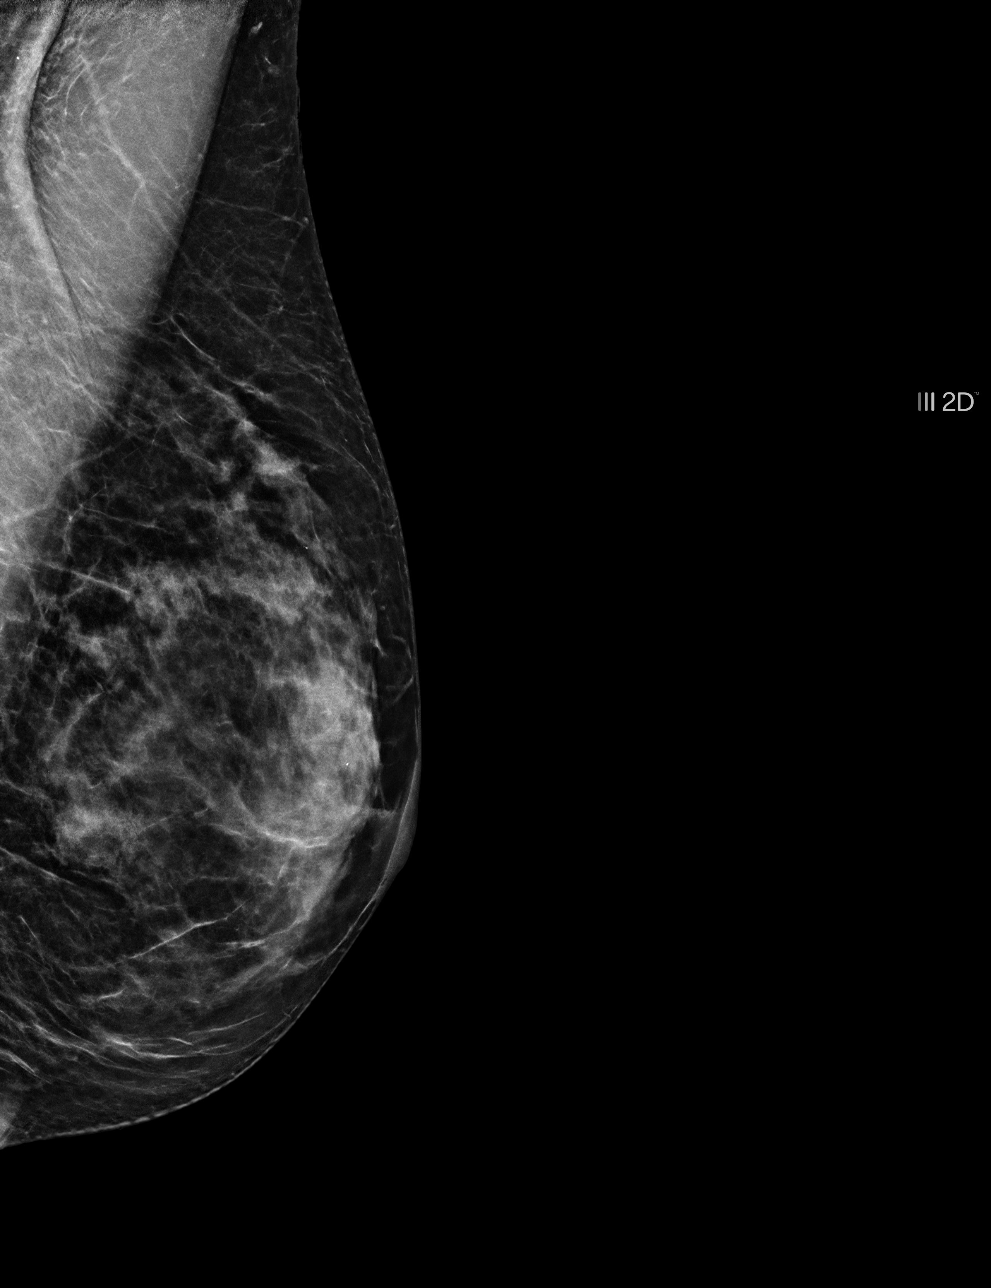

[L CC tomo · tomo slice 27/53.0]
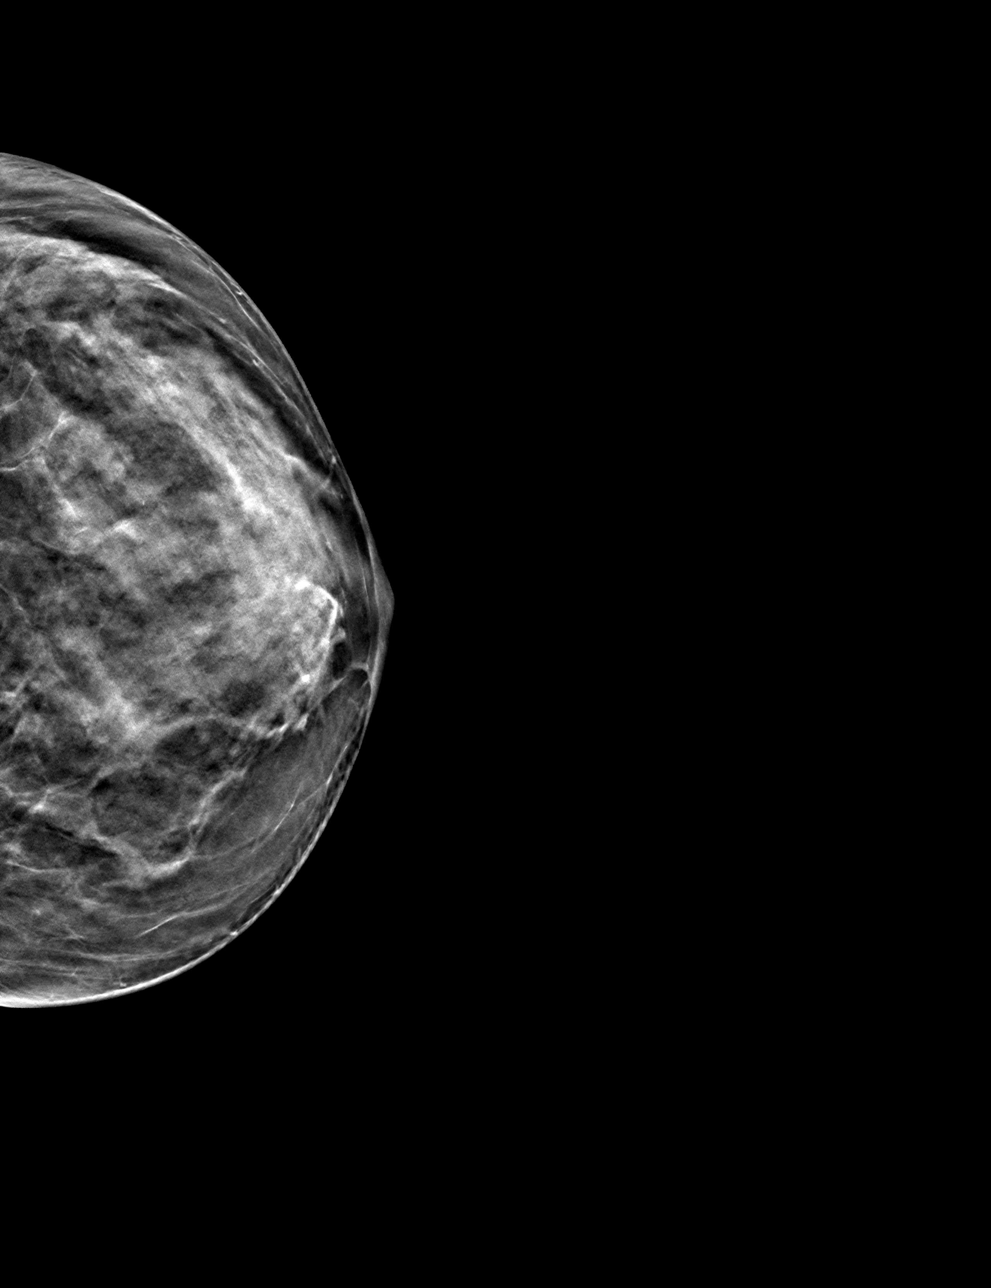

[L MLO tomo · tomo slice 27/52.0]
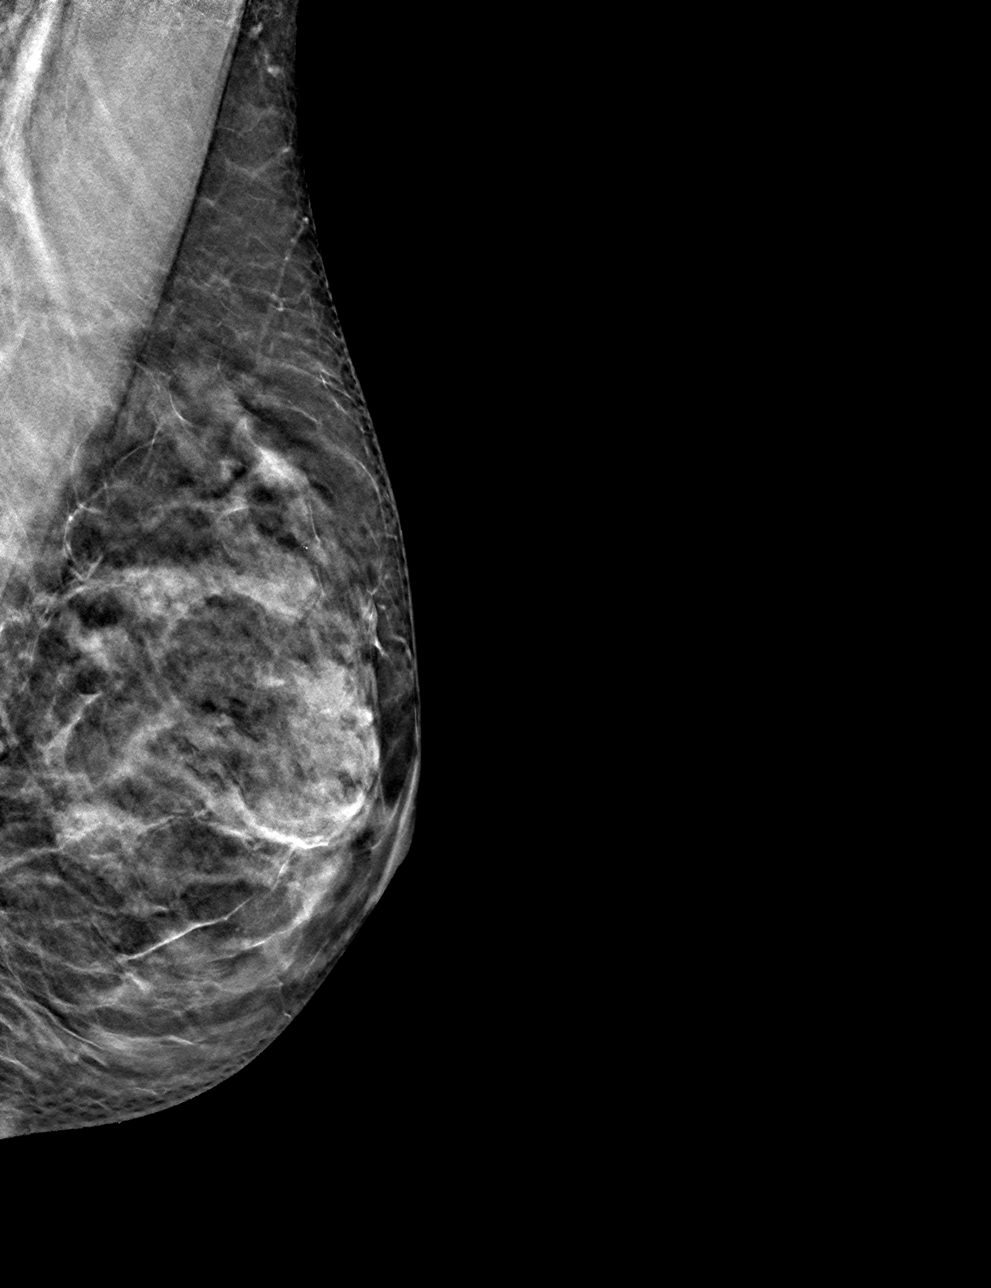

[4 of 12 positions shown; findings below may reference images not displayed]

FINDINGS: No suspicious mass, calcifications, or area of architectural 
distortion in the left breast.
IMPRESSION: Stable exam.  
(BI-RADS 2) Benign findings. Routine mammographic follow-up is recommended.

## 2023-05-24 ENCOUNTER — Encounter: Admit: 2023-05-24 | Payer: PRIVATE HEALTH INSURANCE | Attending: Vascular and Interventional Radiology

## 2023-05-25 IMAGING — MR MRI BREAST BILATERAL W/WO CONTRAST
6 of 15 series · 13 of 48 positions shown · IV contrast (gadolinium)
Comparison: 01/02/2023 and exams dating back to 05/15/2014

________________________________________________________________________________________________ 
******** ADDENDUM #1 ********/n
TECHNIQUE: Multiple sequences were obtained in various planes with both before 
and after the intravenous administration of gadolinium. In addition to the 
routine images, three-dimensional renderings were performed on an independent 
workstation, time activity curves generated over areas of enhancement and 
computer-aided detection utilized. 6.4 mL of Gadavist were injected 
intravenously. 1.1 mL of Gadavist discarded. Patient was scanned on a 3T magnet. 

MRI BREAST BILATERAL W/WO CONTRAST, 05/25/2023 [DATE]: 
CLINICAL INDICATION: Follow-up for right-sided breast cancer found only by MRI 
in 8449. Silicone implant right breast.
intravenously. 1.09 mL of Gadavist discarded. Patient was scanned on a 3T 
magnet.

[Series 103: patient aligned mpr · axial · 12.0mm · 3.40mm/px · 1 of 4 slices shown]
[im 1/4]
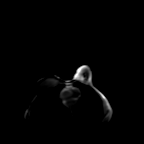

[Series 201: survey · axial · 7.0mm · 1.34mm/px · 1 of 25 slices shown]
[im 1/25]
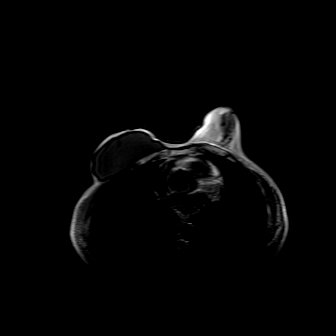

[Series 301: t1w_ffe_3d cs · axial · 1.6mm · 0.50mm/px · z∈[-128,+83]mm · 3 of 202 slices shown]
[im 1/202]
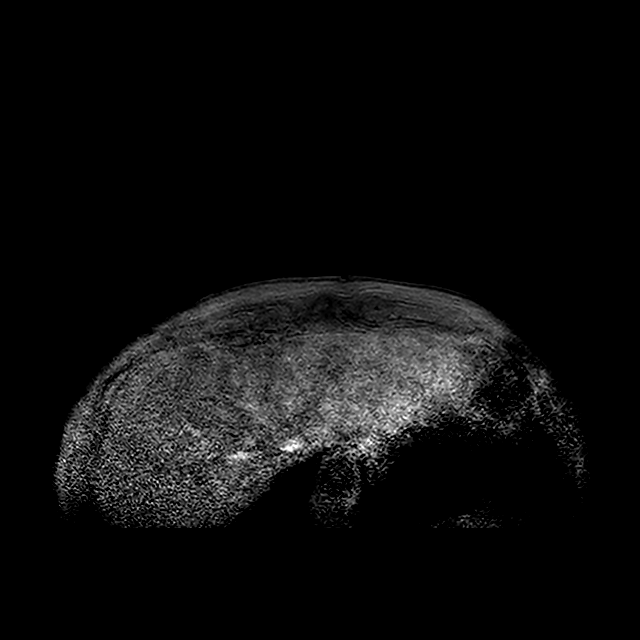
[im 101/202]
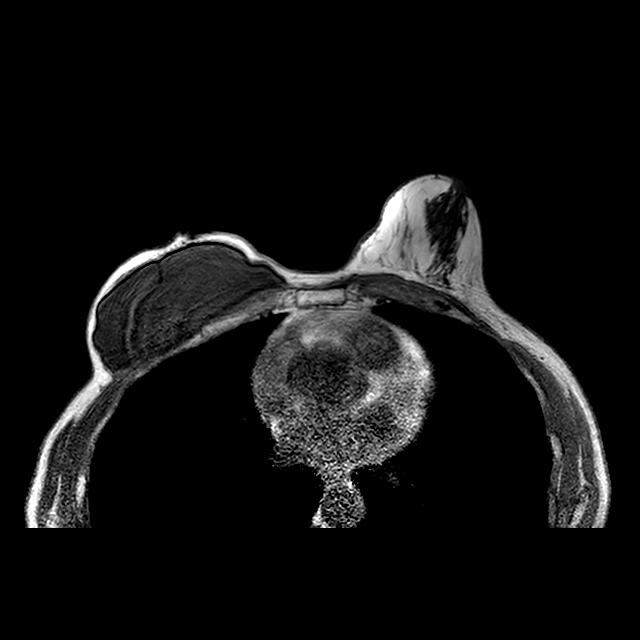
[im 202/202]
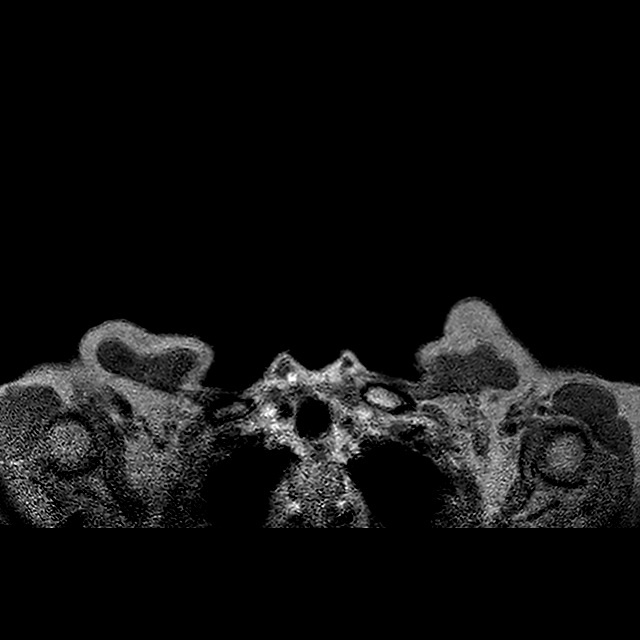

[Series 401: t2w_(id) · axial · 2.5mm · 0.63mm/px · 1 of 77 slices shown]
[im 1/77]
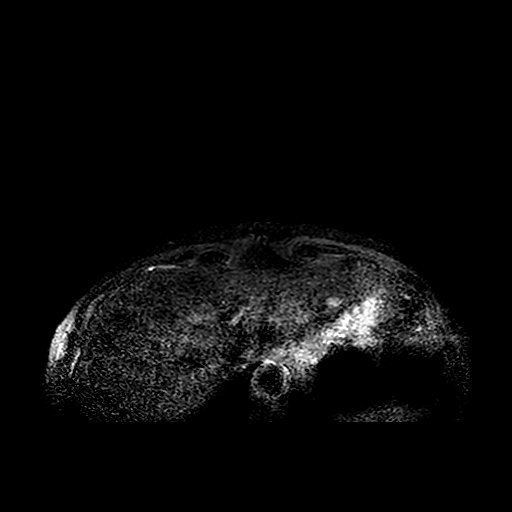

[Series 502: ssub dyn 1 · axial · 1.6mm · 0.40mm/px · z∈[-134,+81]mm · 4 of 270 slices shown]
[im 1/270]
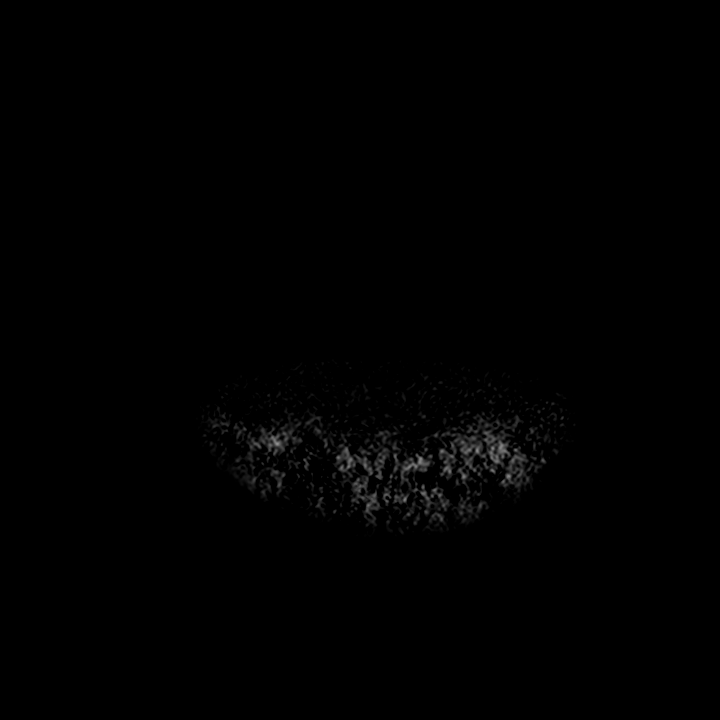
[im 90/270]
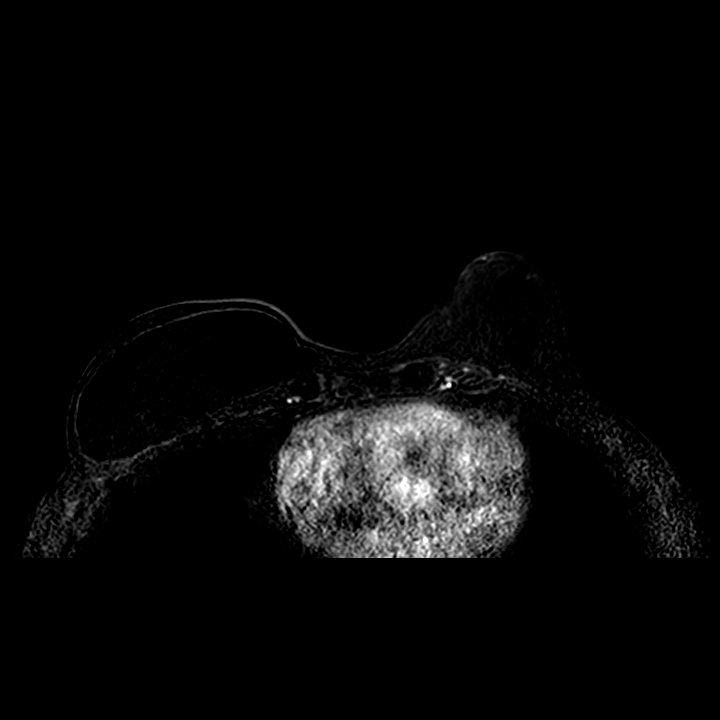
[im 180/270]
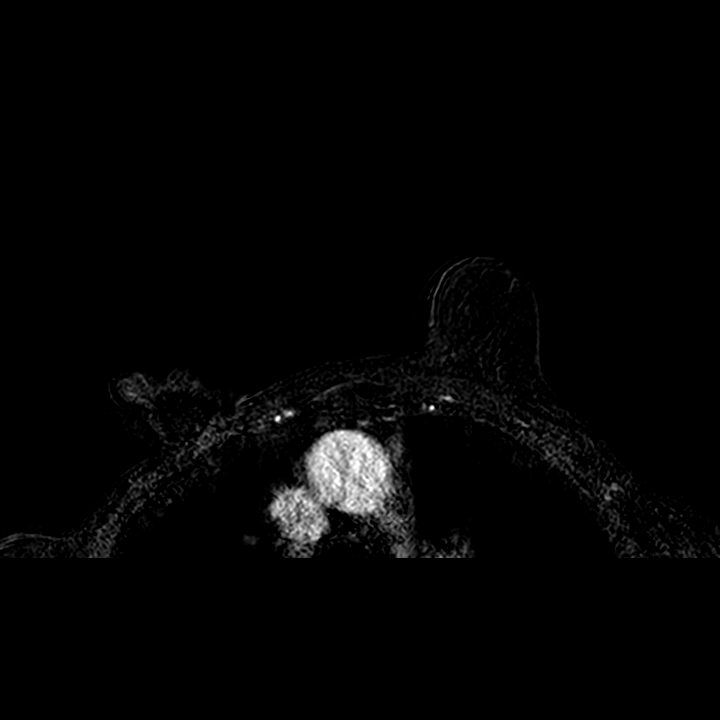
[im 270/270]
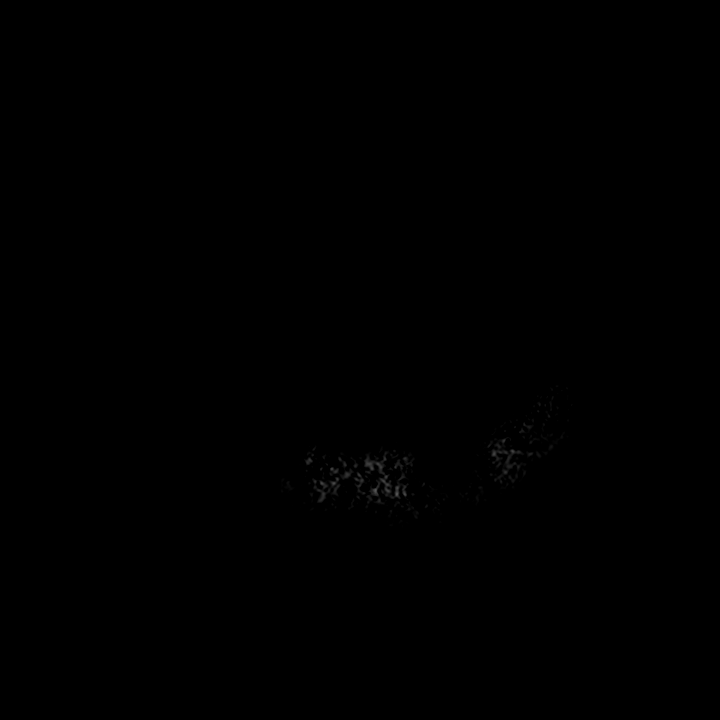

[Series 503: ssub dyn 2 · axial · 1.6mm · 0.40mm/px · z∈[-134,-27]mm · 3 of 270 slices shown]
[im 1/270]
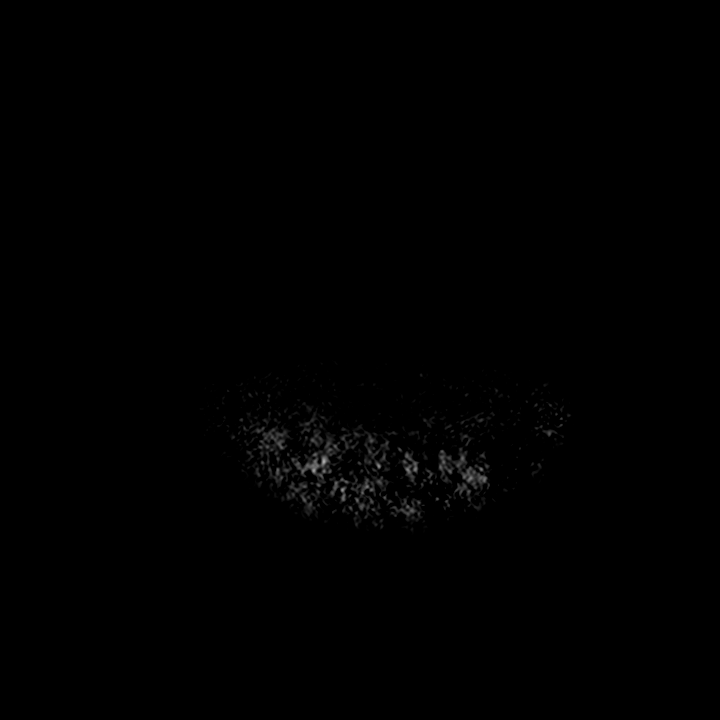
[im 68/270]
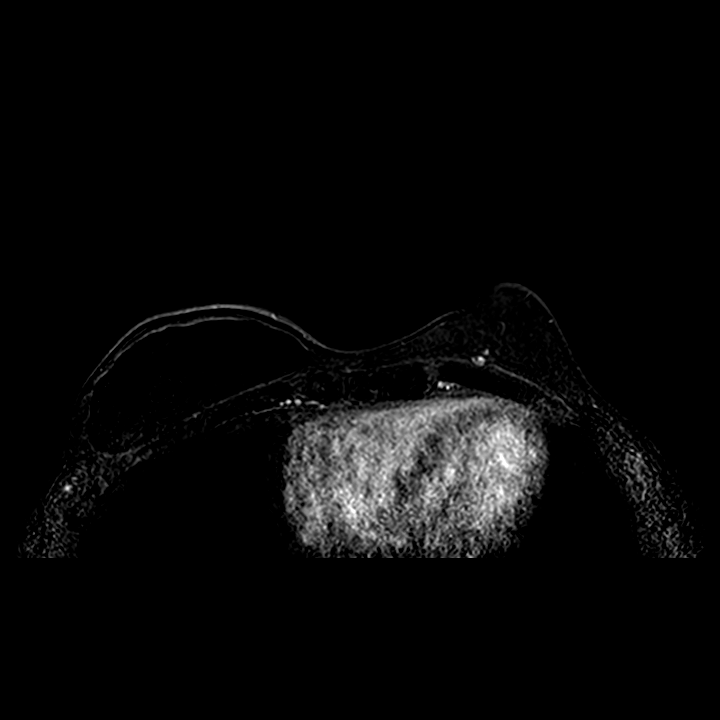
[im 135/270]
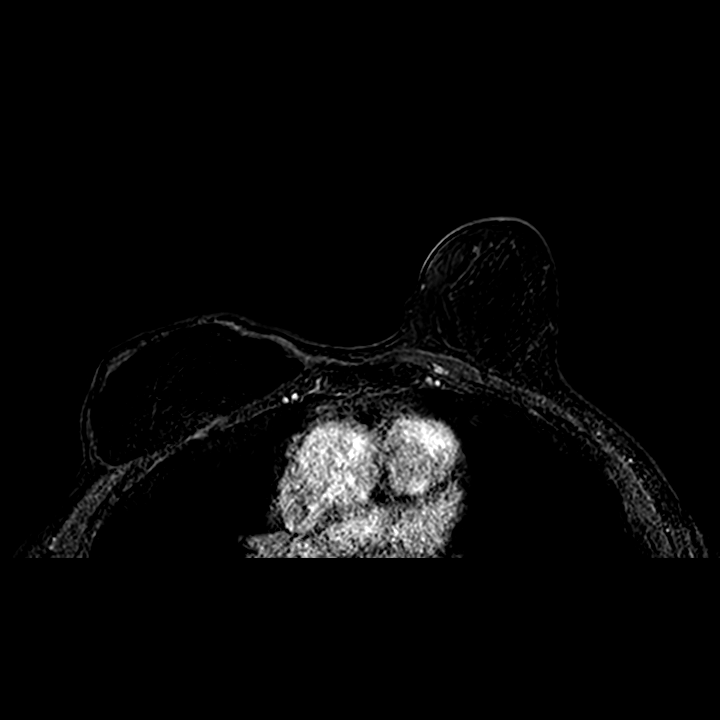

[13 of 48 positions shown; findings below may reference images not displayed]

FINDINGS: Minimal background parenchymal enhancement. 
Right breast: Postsurgical changes of right mastectomy are again seen with 
implant reconstruction. No MR findings to suggest intra or extracapsular 
rupture. No periimplant fluid collections.  No abnormal areas of enhancement. No 
areas of enhancement meeting threshold criteria on CAD analysis. Small 
nonspecific right axillary lymph nodes.  No internal mammary nodes. 
Left breast: No abnormal areas of enhancement. No areas of enhancement meeting 
threshold criteria on CAD analysis. Small nonspecific left axillary lymph nodes. 
 No internal mammary nodes. 
Overall stable MR appearance.
IMPRESSION: No MR findings suggestive for malignancy. 
(BI-RADS 2) Benign findings. Routine mammographic follow-up is recommended.

## 2024-06-09 ENCOUNTER — Encounter: Admit: 2024-06-09 | Payer: PRIVATE HEALTH INSURANCE | Attending: Vascular and Interventional Radiology
